# Patient Record
Sex: Female | Born: 1940 | Race: White | Hispanic: No | Marital: Married | Smoking: Never smoker
Health system: Southern US, Community
[De-identification: ages and names within clinical notes are randomized; demographics above are authoritative.]

## PROBLEM LIST (undated history)

## (undated) DIAGNOSIS — C801 Malignant (primary) neoplasm, unspecified: Secondary | ICD-10-CM

## (undated) DIAGNOSIS — I10 Essential (primary) hypertension: Secondary | ICD-10-CM

## (undated) DIAGNOSIS — E079 Disorder of thyroid, unspecified: Secondary | ICD-10-CM

## (undated) HISTORY — PX: TOTAL HIP ARTHROPLASTY: SHX124

## (undated) HISTORY — PX: ABDOMINAL HYSTERECTOMY: SHX81

---

## 1997-10-26 ENCOUNTER — Ambulatory Visit (HOSPITAL_COMMUNITY): Admission: RE | Admit: 1997-10-26 | Discharge: 1997-10-26 | Payer: Self-pay | Admitting: Internal Medicine

## 1999-01-24 ENCOUNTER — Ambulatory Visit (HOSPITAL_COMMUNITY): Admission: RE | Admit: 1999-01-24 | Discharge: 1999-01-24 | Payer: Self-pay | Admitting: Internal Medicine

## 1999-01-24 ENCOUNTER — Encounter: Payer: Self-pay | Admitting: Internal Medicine

## 2000-10-01 ENCOUNTER — Ambulatory Visit (HOSPITAL_COMMUNITY): Admission: RE | Admit: 2000-10-01 | Discharge: 2000-10-01 | Payer: Self-pay | Admitting: Internal Medicine

## 2004-06-06 ENCOUNTER — Ambulatory Visit (HOSPITAL_COMMUNITY): Admission: RE | Admit: 2004-06-06 | Discharge: 2004-06-06 | Payer: Self-pay | Admitting: Family Medicine

## 2005-08-07 ENCOUNTER — Ambulatory Visit (HOSPITAL_COMMUNITY): Admission: RE | Admit: 2005-08-07 | Discharge: 2005-08-07 | Payer: Self-pay | Admitting: Family Medicine

## 2006-09-10 ENCOUNTER — Encounter: Admission: RE | Admit: 2006-09-10 | Discharge: 2006-09-10 | Payer: Self-pay | Admitting: Family Medicine

## 2007-12-09 ENCOUNTER — Ambulatory Visit (HOSPITAL_COMMUNITY): Admission: RE | Admit: 2007-12-09 | Discharge: 2007-12-09 | Payer: Self-pay | Admitting: Internal Medicine

## 2009-02-05 ENCOUNTER — Ambulatory Visit (HOSPITAL_COMMUNITY): Admission: RE | Admit: 2009-02-05 | Discharge: 2009-02-05 | Payer: Self-pay | Admitting: Internal Medicine

## 2009-09-27 IMAGING — MG MM DIGITAL SCREENING BILAT
4 series · 4 of 4 positions shown · non-contrast
Comparison: none

DG SCREEN MAMMOGRAM BILATERAL
Bilateral CC and MLO view(s) were taken.

DIGITAL SCREENING MAMMOGRAM WITH CAD:
There are scattered fibroglandular densities.  No masses or malignant type calcifications are 
identified.  Compared with prior studies.

[R CC]
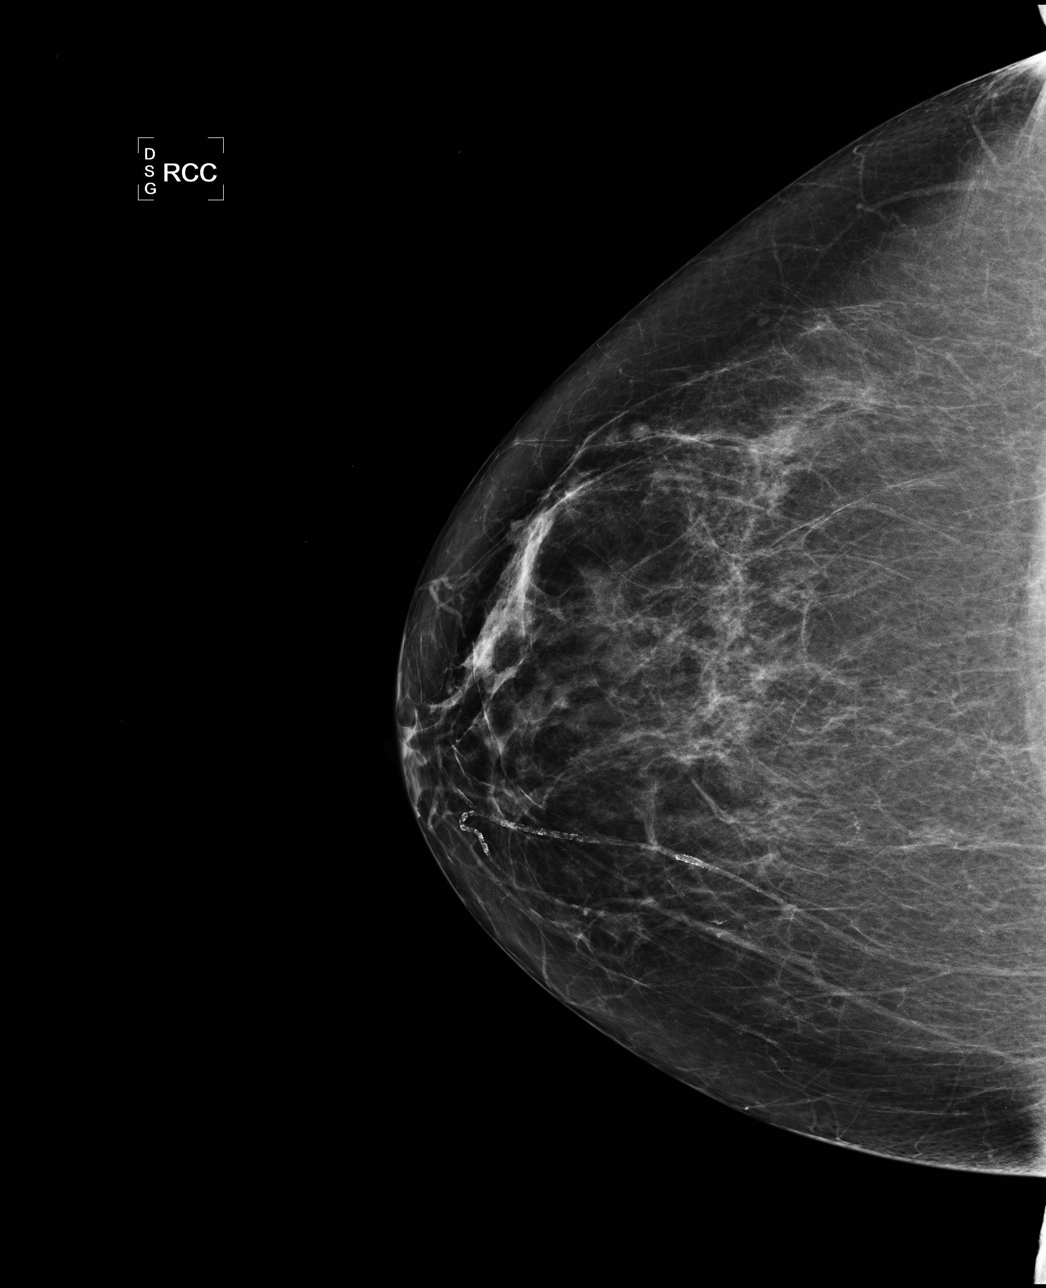

[R MLO]
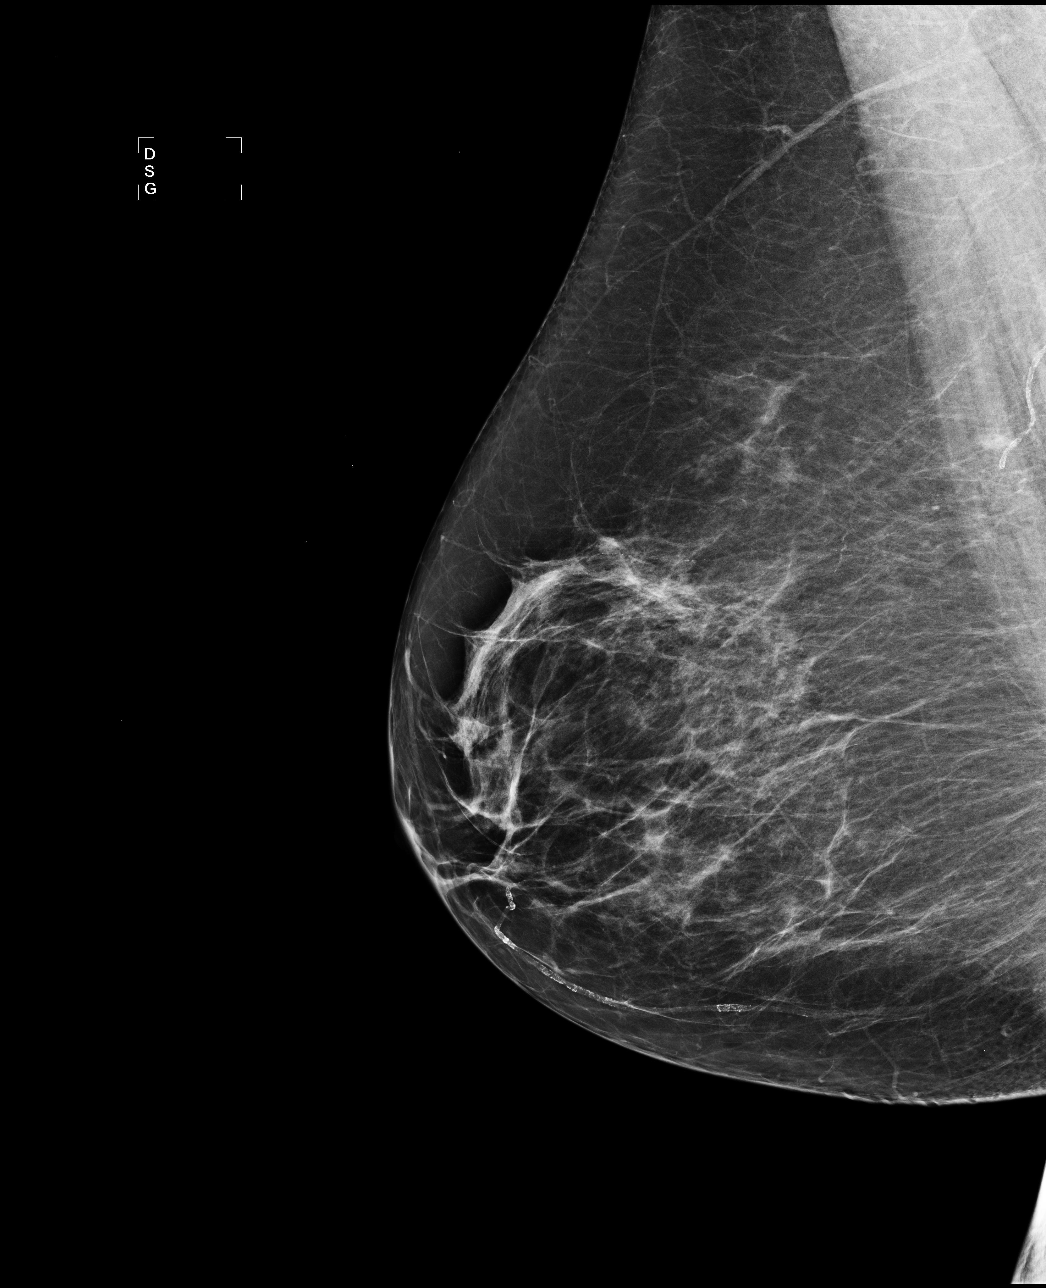

[L CC]
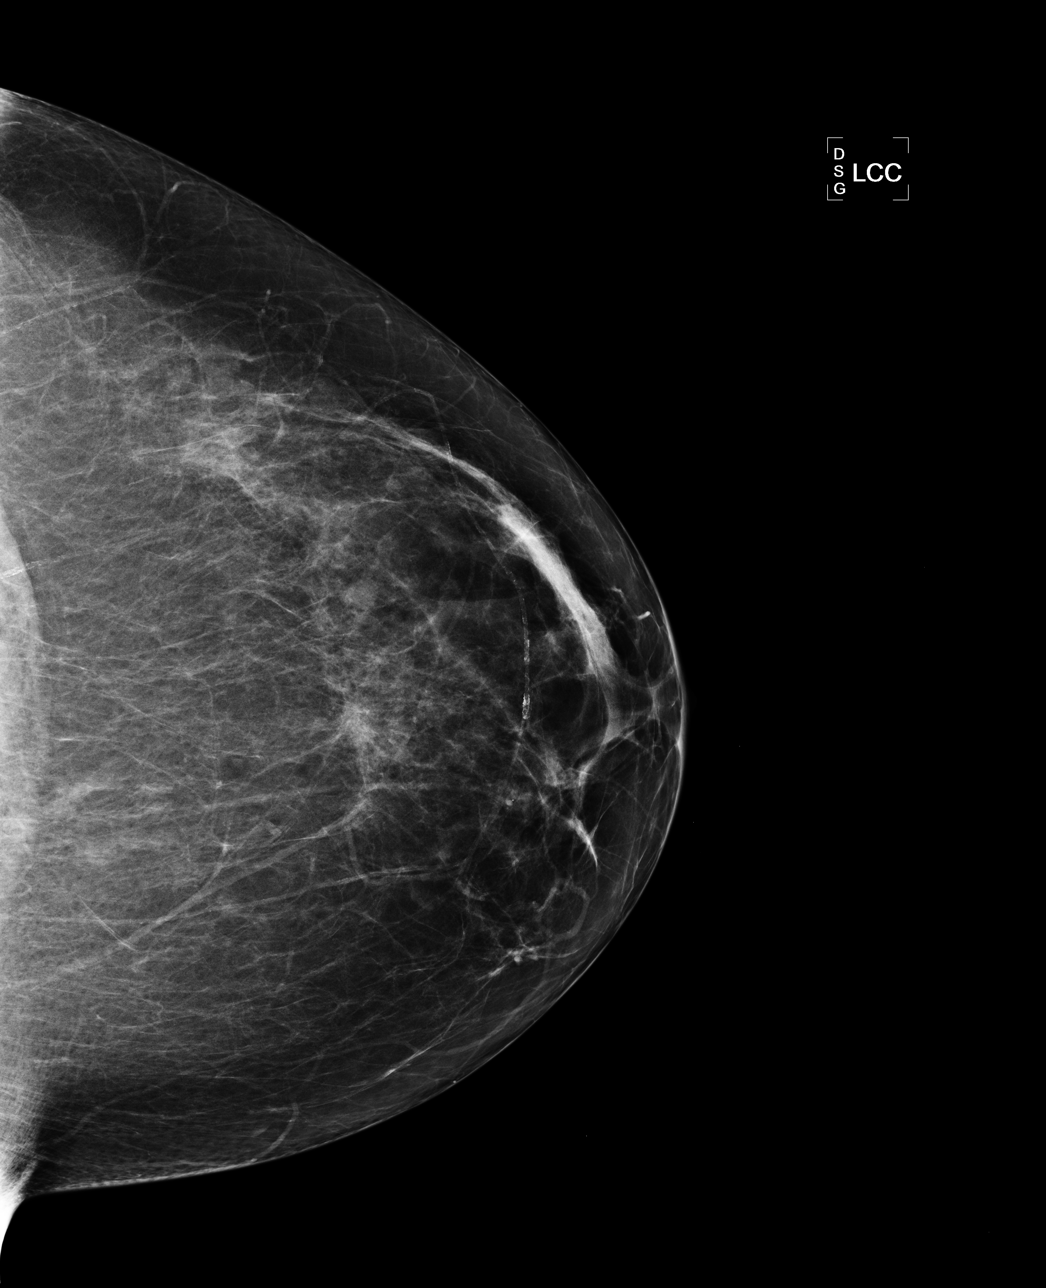

[L MLO]
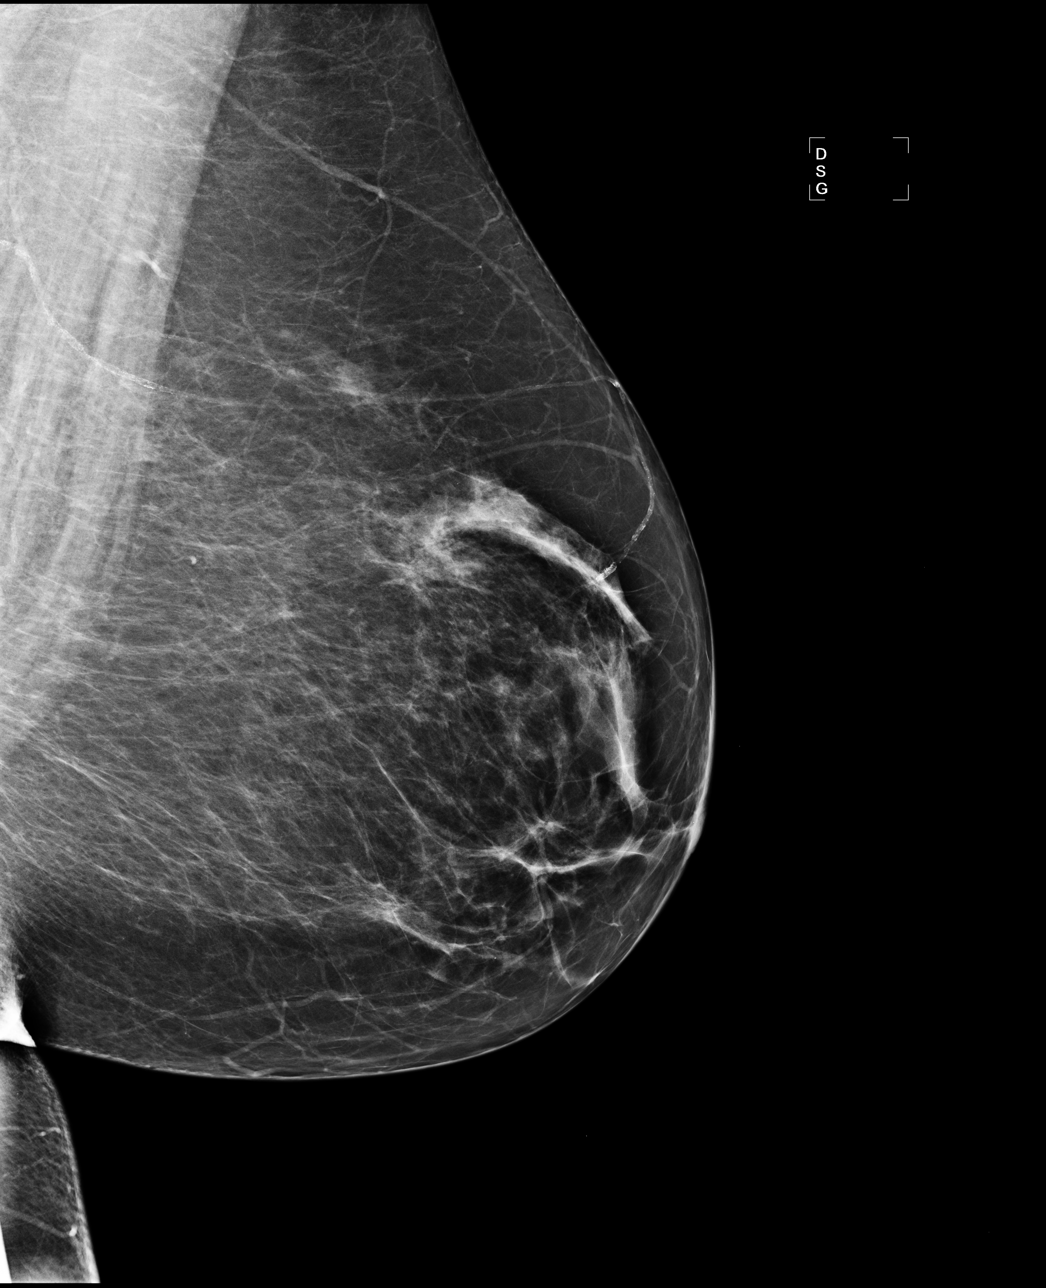

[4 of 4 positions shown; findings below may reference images not displayed]

IMPRESSION: No specific mammographic evidence of malignancy.  Next screening mammogram is recommended in one 
year.

ASSESSMENT: Negative - BI-RADS 1

Screening mammogram in 1 year.
ANALYZED BY COMPUTER AIDED DETECTION. , THIS PROCEDURE WAS A DIGITAL MAMMOGRAM.

## 2010-06-04 ENCOUNTER — Encounter: Payer: Self-pay | Admitting: Internal Medicine

## 2014-12-14 DIAGNOSIS — E039 Hypothyroidism, unspecified: Secondary | ICD-10-CM | POA: Insufficient documentation

## 2014-12-14 DIAGNOSIS — E876 Hypokalemia: Secondary | ICD-10-CM | POA: Insufficient documentation

## 2014-12-14 DIAGNOSIS — E785 Hyperlipidemia, unspecified: Secondary | ICD-10-CM | POA: Insufficient documentation

## 2014-12-14 DIAGNOSIS — I10 Essential (primary) hypertension: Secondary | ICD-10-CM | POA: Insufficient documentation

## 2015-06-26 DIAGNOSIS — I6782 Cerebral ischemia: Secondary | ICD-10-CM | POA: Insufficient documentation

## 2015-06-26 DIAGNOSIS — R1310 Dysphagia, unspecified: Secondary | ICD-10-CM | POA: Insufficient documentation

## 2015-09-07 DIAGNOSIS — M8580 Other specified disorders of bone density and structure, unspecified site: Secondary | ICD-10-CM | POA: Insufficient documentation

## 2015-11-03 DIAGNOSIS — H8112 Benign paroxysmal vertigo, left ear: Secondary | ICD-10-CM | POA: Insufficient documentation

## 2015-11-09 DIAGNOSIS — K219 Gastro-esophageal reflux disease without esophagitis: Secondary | ICD-10-CM | POA: Insufficient documentation

## 2016-02-08 DIAGNOSIS — F411 Generalized anxiety disorder: Secondary | ICD-10-CM | POA: Insufficient documentation

## 2016-02-21 DIAGNOSIS — M5030 Other cervical disc degeneration, unspecified cervical region: Secondary | ICD-10-CM | POA: Insufficient documentation

## 2016-09-12 DIAGNOSIS — R413 Other amnesia: Secondary | ICD-10-CM | POA: Insufficient documentation

## 2018-11-01 DIAGNOSIS — M5412 Radiculopathy, cervical region: Secondary | ICD-10-CM | POA: Insufficient documentation

## 2019-04-02 DIAGNOSIS — M810 Age-related osteoporosis without current pathological fracture: Secondary | ICD-10-CM | POA: Insufficient documentation

## 2019-06-05 DIAGNOSIS — F32A Depression, unspecified: Secondary | ICD-10-CM | POA: Insufficient documentation

## 2021-03-07 ENCOUNTER — Other Ambulatory Visit: Payer: Self-pay

## 2021-03-07 ENCOUNTER — Emergency Department (HOSPITAL_BASED_OUTPATIENT_CLINIC_OR_DEPARTMENT_OTHER)
Admission: EM | Admit: 2021-03-07 | Discharge: 2021-03-07 | Disposition: A | Discharge status: Home / Self Care | Payer: Medicare Other | Attending: Emergency Medicine | Admitting: Emergency Medicine

## 2021-03-07 ENCOUNTER — Encounter (HOSPITAL_BASED_OUTPATIENT_CLINIC_OR_DEPARTMENT_OTHER): Payer: Self-pay

## 2021-03-07 DIAGNOSIS — R443 Hallucinations, unspecified: Secondary | ICD-10-CM | POA: Diagnosis not present

## 2021-03-07 DIAGNOSIS — N390 Urinary tract infection, site not specified: Secondary | ICD-10-CM | POA: Insufficient documentation

## 2021-03-07 DIAGNOSIS — E876 Hypokalemia: Secondary | ICD-10-CM | POA: Diagnosis not present

## 2021-03-07 DIAGNOSIS — F02B4 Dementia in other diseases classified elsewhere, moderate, with anxiety: Secondary | ICD-10-CM | POA: Insufficient documentation

## 2021-03-07 DIAGNOSIS — Z79899 Other long term (current) drug therapy: Secondary | ICD-10-CM | POA: Diagnosis not present

## 2021-03-07 DIAGNOSIS — R799 Abnormal finding of blood chemistry, unspecified: Secondary | ICD-10-CM | POA: Diagnosis present

## 2021-03-07 DIAGNOSIS — I10 Essential (primary) hypertension: Secondary | ICD-10-CM | POA: Diagnosis not present

## 2021-03-07 DIAGNOSIS — Z859 Personal history of malignant neoplasm, unspecified: Secondary | ICD-10-CM | POA: Diagnosis not present

## 2021-03-07 DIAGNOSIS — Z7982 Long term (current) use of aspirin: Secondary | ICD-10-CM | POA: Insufficient documentation

## 2021-03-07 HISTORY — DX: Disorder of thyroid, unspecified: E07.9

## 2021-03-07 HISTORY — DX: Malignant (primary) neoplasm, unspecified: C80.1

## 2021-03-07 HISTORY — DX: Essential (primary) hypertension: I10

## 2021-03-07 LAB — BASIC METABOLIC PANEL
Anion gap: 9 (ref 5–15)
BUN: 22 mg/dL (ref 8–23)
CO2: 31 mmol/L (ref 22–32)
Calcium: 9.7 mg/dL (ref 8.9–10.3)
Chloride: 98 mmol/L (ref 98–111)
Creatinine, Ser: 0.82 mg/dL (ref 0.44–1.00)
GFR, Estimated: >60 mL/min (ref >60)
Glucose, Bld: 146 mg/dL — ABNORMAL HIGH (ref 70–99)
Potassium: 2.4 mmol/L — CL (ref 3.5–5.1)
Sodium: 138 mmol/L (ref 135–145)

## 2021-03-07 LAB — CBC WITH DIFFERENTIAL/PLATELET
Abs Immature Granulocytes: 0.02 10*3/uL (ref 0.00–0.07)
Basophils Absolute: 0.1 10*3/uL (ref 0.0–0.1)
Basophils Relative: 1 %
Eosinophils Absolute: 0.1 10*3/uL (ref 0.0–0.5)
Eosinophils Relative: 1 %
HCT: 37.5 % (ref 36.0–46.0)
Hemoglobin: 12.5 g/dL (ref 12.0–15.0)
Immature Granulocytes: 0 %
Lymphocytes Relative: 29 %
Lymphs Abs: 2.1 10*3/uL (ref 0.7–4.0)
MCH: 29.6 pg (ref 26.0–34.0)
MCHC: 33.3 g/dL (ref 30.0–36.0)
MCV: 88.9 fL (ref 80.0–100.0)
Monocytes Absolute: 0.8 10*3/uL (ref 0.1–1.0)
Monocytes Relative: 11 %
Neutro Abs: 4.2 10*3/uL (ref 1.7–7.7)
Neutrophils Relative %: 58 %
Platelets: 263 10*3/uL (ref 150–400)
RBC: 4.22 MIL/uL (ref 3.87–5.11)
RDW: 14.6 % (ref 11.5–15.5)
WBC: 7.2 10*3/uL (ref 4.0–10.5)
nRBC: 0 % (ref 0.0–0.2)

## 2021-03-07 LAB — POTASSIUM: Potassium: 3 mmol/L — ABNORMAL LOW (ref 3.5–5.1)

## 2021-03-07 MED ORDER — POTASSIUM CHLORIDE CRYS ER 20 MEQ PO TBCR
40.0000 meq | EXTENDED_RELEASE_TABLET | Freq: Once | ORAL | Status: AC
  Administered 2021-03-07: 40 meq via ORAL
  Filled 2021-03-07: qty 2

## 2021-03-07 MED ORDER — SODIUM CHLORIDE 0.9 % IV SOLN
1.0000 g | Freq: Once | INTRAVENOUS | Status: AC
  Administered 2021-03-07: 1 g via INTRAVENOUS
  Filled 2021-03-07: qty 10

## 2021-03-07 MED ORDER — POTASSIUM CHLORIDE 10 MEQ/100ML IV SOLN
10.0000 meq | INTRAVENOUS | Status: AC
Start: 2021-03-07 — End: 2021-03-07
  Administered 2021-03-07 (×2): 10 meq via INTRAVENOUS
  Filled 2021-03-07 (×2): qty 100

## 2021-03-07 MED ORDER — POTASSIUM CHLORIDE CRYS ER 20 MEQ PO TBCR
20.0000 meq | EXTENDED_RELEASE_TABLET | Freq: Once | ORAL | Status: AC
  Administered 2021-03-07: 20 meq via ORAL

## 2021-03-07 MED ORDER — CEPHALEXIN 500 MG PO CAPS
500.0000 mg | ORAL_CAPSULE | Freq: Four times a day (QID) | ORAL | 0 refills | Status: AC
Start: 2021-03-07 — End: ?

## 2021-03-07 NOTE — Discharge Instructions (Signed)
1.  You have been given a dose of Rocephin in the emergency department.  This is an antibiotic for your urinary tract infection.  You may start your Keflex as prescribed tomorrow evening for urinary tract infection. 2.  Your medication list indicates you have potassium 20 mEq to take once daily.  Take 20 mEq twice daily, 1 in the morning and 1 in the evening for the next 3 days, then resume your once daily dosing.  Try to schedule an appointment with your doctor for a recheck on your potassium within the next 2 to 3 days. 3.  Return to emergency part immediately if you are having any worsening confusion, weakness, fever new or unusual pain or other concerning symptoms

## 2021-03-07 NOTE — ED Provider Notes (Signed)
[facility] Provider Note   CSN: [id number] Arrival date & time: 03/07/21  1456     History Chief Complaint  Patient presents with   Abnormal Lab    [facility] is a 80 y.o. female.  HPI Patient sent to emergency department for low potassium level.  Patient reports she stopped taking her potassium about 2 weeks ago.  She states she did this because the pills are big and hard to take.  She reports she did not know what would result in having significant side effects or require her to come into the hospital.  Patient initially went to see her PCP because her daughters report she is having hallucinations.  This started about a week or 2 ago.  She is hallucinating people coming into her home.  Sometimes she might see someone standing on her deck smoking, a plumber or whole family coming into her home and sitting on the couch.  She does not seem to be having other associated symptoms.  Patient does have history of dementia and recently got tapered and then taken off of Exelon.  Patient had a fall couple weeks ago resulting in sacral fracture.  She has had increasing difficulty walking since that time.  She reports she is also having some burning pain in her feet.  She has had some injections through pain management but has not gotten much improvement.  Patient's daughters also report that a urinalysis was done today.  They suspected UTI might be contributing to the patient's symptoms.  They report that there was abnormal result in the "my chart".  However at this time the physician had not yet had time to review results and make recommendations.    Past Medical History:  Diagnosis Date   Cancer [facility])    Hypertension    Thyroid disease     There are no problems to display for this patient.      OB History   No obstetric history on file.     No family history on file.  Social History   Tobacco Use   Smoking status: Never   Smokeless tobacco:  Never  Vaping Use   Vaping Use: Never used  Substance Use Topics   Alcohol use: Never   Drug use: Never    Home Medications Prior to Admission medications   Medication Sig Start Date End Date Taking? Authorizing Provider  ALPRAZolam Duanne Moron) 0.25 MG tablet Take by mouth. 01/08/17  Yes [provider]  amLODipine (NORVASC) 5 MG tablet Take 1 tablet by mouth daily. 10/18/20  Yes [provider]  aspirin 81 MG EC tablet Take by mouth. 08/22/10  Yes [provider]  atorvastatin (LIPITOR) 10 MG tablet Take 1 tablet by mouth daily. 09/12/16  Yes [provider]  cephALEXin (KEFLEX) 500 MG capsule Take 1 capsule (500 mg total) by mouth 4 (four) times daily. 03/07/21  Yes Charlesetta Shanks, MD  dexlansoprazole (DEXILANT) 60 MG capsule Take by mouth. 07/19/15  Yes [provider]  escitalopram (LEXAPRO) 10 MG tablet Take 1 tablet by mouth daily. 09/06/17  Yes [provider]  esomeprazole (NEXIUM) 20 MG capsule Take by mouth. 01/10/21  Yes [provider]  fexofenadine (ALLEGRA) 180 MG tablet Take by mouth. 09/15/20  Yes [provider]  gabapentin (NEURONTIN) 100 MG capsule Take by mouth. 11/24/16  Yes [provider]  losartan (COZAAR) 25 MG tablet Take 1 tablet by mouth daily. 02/21/21  Yes [provider]  meloxicam (MOBIC) 15 MG tablet Take by mouth. 02/15/21 03/29/21 Yes [provider]  ofloxacin (OCUFLOX) 0.3 % ophthalmic solution 1 drop 4 times a day in designated eye, 3 days pre op and 3 days post op. 08/26/20 08/26/21 Yes [provider]  potassium chloride SA (KLOR-CON) 20 MEQ tablet Take 1 tablet by mouth daily. 01/08/17  Yes [provider]  triamterene-hydrochlorothiazide (DYAZIDE) 37.5-25 MG capsule Take 1 capsule by mouth every morning. 01/19/21  Yes [provider]  amLODipine (NORVASC) 10 MG tablet Take 10 mg by mouth daily. 09/15/20   [provider]  Cholecalciferol 25  MCG (1000 UT) capsule Take by mouth.    [provider]  DULoxetine (CYMBALTA) 60 MG capsule Take 60 mg by mouth daily. 12/21/20   [provider]  rivastigmine (EXELON) 4.5 MG capsule Take 4.5 mg by mouth 2 (two) times daily. 01/14/21   [provider]  triamterene-hydrochlorothiazide (DYAZIDE) 37.5-25 MG capsule Take 1 capsule by mouth every morning. 01/31/21   [provider]    Allergies    Patient has no known allergies.  Review of Systems   Review of Systems 10 systems reviewed and negative except as per HPI Physical Exam Updated Vital Signs BP (!) 146/74   Pulse 66   Temp 98.7 F (37.1 C) (Oral)   Resp 17   Ht 5\' 2"  (1.575 m)   Wt 54.9 kg   SpO2 92%   BMI 22.13 kg/m   Physical Exam Constitutional:      Comments: Alert nontoxic.  No respiratory distress.  Clear mental status  HENT:     Head: Normocephalic and atraumatic.     Mouth/Throat:     Pharynx: Oropharynx is clear.  Eyes:     Extraocular Movements: Extraocular movements intact.  Cardiovascular:     Rate and Rhythm: Normal rate and regular rhythm.  Pulmonary:     Effort: Pulmonary effort is normal.     Breath sounds: Normal breath sounds.  Abdominal:     General: There is no distension.     Palpations: Abdomen is soft.     Tenderness: There is no abdominal tenderness. There is no guarding.  Musculoskeletal:     Comments: Patient has significant low back curvature.  No peripheral edema.  Calf soft and nontender.  No signs of cellulitis or wounds to the feet  Skin:    General: Skin is warm and dry.  Neurological:     General: No focal deficit present.     Mental Status: She is oriented to person, place, and time.  Psychiatric:        Mood and Affect: Mood normal.    ED Results / Procedures / Treatments   Labs (all labs ordered are listed, but only abnormal results are displayed) Labs Reviewed  BASIC METABOLIC PANEL - Abnormal; Notable for the following components:       Result Value   Potassium 2.4 (*)    Glucose, Bld 146 (*)    All other components within normal limits  POTASSIUM - Abnormal; Notable for the following components:   Potassium 3.0 (*)    All other components within normal limits  URINE CULTURE  CBC WITH DIFFERENTIAL/PLATELET    EKG EKG Interpretation  Date/Time:  Monday March 07 2021 15:26:45 EDT Ventricular Rate:  80 PR Interval:  152 QRS Duration: 90 QT Interval:  370 QTC Calculation: 426 R Axis:   -57 Text Interpretation: Sinus rhythm with marked sinus arrhythmia Pulmonary disease pattern  Left anterior fascicular block Abnormal ECG agree, no old comparison Confirmed by Charlesetta Shanks [phone]) on 03/07/2021 9:33:02 PM  Radiology No results found.  Procedures Procedures   Medications Ordered in ED Medications  potassium chloride SA (KLOR-CON) CR tablet 20 mEq (has no administration in time range)  potassium chloride SA (KLOR-CON) CR tablet 40 mEq (40 mEq Oral Given 03/07/21 1715)  potassium chloride 10 mEq in 100 mL IVPB (0 mEq Intravenous Stopped 03/07/21 1922)  cefTRIAXone (ROCEPHIN) 1 g in sodium chloride 0.9 % 100 mL IVPB (0 g Intravenous Stopped 03/07/21 2109)    ED Course  I have reviewed the triage vital signs and the nursing notes.  Pertinent labs & imaging results that were available during my care of the patient were reviewed by me and considered in my medical decision making (see chart for details).    MDM Rules/Calculators/A&P                           Patient alert and nontoxic.  She is clinically well in appearance.  Patient was found to be significantly hypokalemic by diagnostic testing through PCP.  Patient has not overtly symptomatic.  She stopped taking potassium couple weeks ago, not realizing that it would make a significant difference in her medical care.  Patient also has had some hallucinations and suspected UTI.  Other possible causes for hallucinations are known history of dementia and recent  changes in dementia medications.  Review of labs done today are consistent with UTI.  Patient is nontoxic without any SIRS symptoms.  She is with family.  At this time will give first dose of Rocephin IV and start patient on Keflex.  Careful return precautions reviewed. Final Clinical Impression(s) / ED Diagnoses Final diagnoses:  Hypokalemia  Lower urinary tract infectious disease    Rx / DC Orders ED Discharge Orders          Ordered    cephALEXin (KEFLEX) 500 MG capsule  4 times daily        03/07/21 2137             Charlesetta Shanks, MD 03/07/21 2141

## 2021-03-07 NOTE — ED Triage Notes (Signed)
Pt was seen at PCP today for hallucinations, had bloodwork and urine checked. Potassium came back at 2.8. Hasnt been taking her potassium supplements. Denies symptoms.

## 2022-06-15 ENCOUNTER — Encounter (HOSPITAL_BASED_OUTPATIENT_CLINIC_OR_DEPARTMENT_OTHER): Payer: Self-pay

## 2022-06-15 ENCOUNTER — Emergency Department (HOSPITAL_BASED_OUTPATIENT_CLINIC_OR_DEPARTMENT_OTHER)
Admission: EM | Admit: 2022-06-15 | Discharge: 2022-06-15 | Disposition: A | Discharge status: Home / Self Care | Payer: Medicare Other | Attending: Emergency Medicine | Admitting: Emergency Medicine

## 2022-06-15 ENCOUNTER — Other Ambulatory Visit: Payer: Self-pay

## 2022-06-15 ENCOUNTER — Emergency Department (HOSPITAL_BASED_OUTPATIENT_CLINIC_OR_DEPARTMENT_OTHER): Payer: Medicare Other

## 2022-06-15 DIAGNOSIS — E039 Hypothyroidism, unspecified: Secondary | ICD-10-CM | POA: Diagnosis not present

## 2022-06-15 DIAGNOSIS — R440 Auditory hallucinations: Secondary | ICD-10-CM | POA: Diagnosis not present

## 2022-06-15 DIAGNOSIS — Z79899 Other long term (current) drug therapy: Secondary | ICD-10-CM | POA: Diagnosis not present

## 2022-06-15 DIAGNOSIS — I10 Essential (primary) hypertension: Secondary | ICD-10-CM | POA: Insufficient documentation

## 2022-06-15 DIAGNOSIS — H43813 Vitreous degeneration, bilateral: Secondary | ICD-10-CM | POA: Insufficient documentation

## 2022-06-15 DIAGNOSIS — G309 Alzheimer's disease, unspecified: Secondary | ICD-10-CM | POA: Insufficient documentation

## 2022-06-15 DIAGNOSIS — R443 Hallucinations, unspecified: Secondary | ICD-10-CM | POA: Diagnosis present

## 2022-06-15 DIAGNOSIS — R410 Disorientation, unspecified: Secondary | ICD-10-CM | POA: Insufficient documentation

## 2022-06-15 DIAGNOSIS — Z7982 Long term (current) use of aspirin: Secondary | ICD-10-CM | POA: Diagnosis not present

## 2022-06-15 LAB — URINALYSIS, MICROSCOPIC (REFLEX)

## 2022-06-15 LAB — COMPREHENSIVE METABOLIC PANEL
ALT: 11 U/L (ref 0–44)
AST: 17 U/L (ref 15–41)
Albumin: 4.2 g/dL (ref 3.5–5.0)
Alkaline Phosphatase: 78 U/L (ref 38–126)
Anion gap: 9 (ref 5–15)
BUN: 14 mg/dL (ref 8–23)
CO2: 34 mmol/L — ABNORMAL HIGH (ref 22–32)
Calcium: 9.8 mg/dL (ref 8.9–10.3)
Chloride: 98 mmol/L (ref 98–111)
Creatinine, Ser: 0.95 mg/dL (ref 0.44–1.00)
GFR, Estimated: 60 mL/min — ABNORMAL LOW (ref >60)
Glucose, Bld: 101 mg/dL — ABNORMAL HIGH (ref 70–99)
Potassium: 3 mmol/L — ABNORMAL LOW (ref 3.5–5.1)
Sodium: 141 mmol/L (ref 135–145)
Total Bilirubin: 0.7 mg/dL (ref 0.3–1.2)
Total Protein: 6.9 g/dL (ref 6.5–8.1)

## 2022-06-15 LAB — RAPID URINE DRUG SCREEN, HOSP PERFORMED
Amphetamines: NOT DETECTED
Barbiturates: NOT DETECTED
Benzodiazepines: NOT DETECTED
Cocaine: NOT DETECTED
Opiates: NOT DETECTED
Tetrahydrocannabinol: NOT DETECTED

## 2022-06-15 LAB — URINALYSIS, ROUTINE W REFLEX MICROSCOPIC
Bilirubin Urine: NEGATIVE
Glucose, UA: NEGATIVE mg/dL
Hgb urine dipstick: NEGATIVE
Ketones, ur: NEGATIVE mg/dL
Leukocytes,Ua: NEGATIVE
Nitrite: NEGATIVE
Protein, ur: 100 mg/dL — AB
Specific Gravity, Urine: 1.025 (ref 1.005–1.030)
pH: 6.5 (ref 5.0–8.0)

## 2022-06-15 LAB — CBC
HCT: 34.6 % — ABNORMAL LOW (ref 36.0–46.0)
Hemoglobin: 11.3 g/dL — ABNORMAL LOW (ref 12.0–15.0)
MCH: 26 pg (ref 26.0–34.0)
MCHC: 32.7 g/dL (ref 30.0–36.0)
MCV: 79.5 fL — ABNORMAL LOW (ref 80.0–100.0)
Platelets: 178 10*3/uL (ref 150–400)
RBC: 4.35 MIL/uL (ref 3.87–5.11)
RDW: 14.7 % (ref 11.5–15.5)
WBC: 6.9 10*3/uL (ref 4.0–10.5)
nRBC: 0 % (ref 0.0–0.2)

## 2022-06-15 LAB — ACETAMINOPHEN LEVEL: Acetaminophen (Tylenol), Serum: <10 ug/mL — ABNORMAL LOW (ref 10–30)

## 2022-06-15 LAB — ETHANOL: Alcohol, Ethyl (B): <10 mg/dL (ref <10)

## 2022-06-15 LAB — SALICYLATE LEVEL: Salicylate Lvl: <7 mg/dL — ABNORMAL LOW (ref 7.0–30.0)

## 2022-06-15 MED ORDER — POTASSIUM CHLORIDE CRYS ER 20 MEQ PO TBCR
40.0000 meq | EXTENDED_RELEASE_TABLET | Freq: Once | ORAL | Status: AC
  Administered 2022-06-15: 40 meq via ORAL
  Filled 2022-06-15: qty 2

## 2022-06-15 NOTE — ED Triage Notes (Signed)
Daughter states patient has had hallucinations since yesterday. Seeing people that aren't there. Daughter denies confusion, A&O x4 to orientation questions. Denies urinary symptoms.

## 2022-06-15 NOTE — Discharge Instructions (Signed)
Thank you for coming to [facility] Emergency Department. You were seen for confusion, auditory hallucinations. We did an exam, labs, and imaging, and these showed no acute findings. Please follow up with your primary care provider within 1 week for further workup and evaluation.  Do not hesitate to return to the ED or call 911 if you experience: -Worsening symptoms -Falls, trauma -Lethargy -Changes to vision -Lightheadedness, passing out -Fevers/chills -Anything else that concerns you

## 2022-06-15 NOTE — ED Provider Notes (Signed)
[facility] Provider Note   CSN: [id number] Arrival date & time: 06/15/22  [id number]     History  Chief Complaint  Patient presents with   Hallucinations    [facility] is a 82 y.o. female with Alzheimer's, HLD, HTN, hypothyroidism, GAD, GERD, osteoporosis, hypokalemia, depression, memory loss, BPPV, DDD, who presents with hallucinations.   Patient presents with daughter who provides history.  Patient's daughter states that she has noticed her mother be increasingly confused over the last week or so. She has noticed also that patient has had hallucinations since yesterday. Hearing people in her home that aren't there. Asked her daughter yesterday to come to her home to check to make sure there weren't people upstairs because she thought she heard them talking. Also discussed with her friend that she had a party the night before, but there was no party.  Patient states that she feels well at this time and has no complaints.  She has chronic lower back pain that is at its baseline.  She is currently A&Ox4, and she denies any recent fevers chills, chest pain, shortness of breath, abdominal pain, nausea or diarrhea constipation, lower extremity edema, falls or head trauma. She endorses recent confusion and states she thought she had a party but was surprised when there wasn't one. Denies any drug or alcohol use. Denies recent medication changes.  Daughter states this has happened before remotely and there was a medication cessation made that improved her symptoms. She wonders if there is a medication we could adjust today.   HPI     Home Medications Prior to Admission medications   Medication Sig Start Date End Date Taking? Authorizing Provider  ALPRAZolam Duanne Moron) 0.25 MG tablet Take by mouth. 01/08/17   [provider]  amLODipine (NORVASC) 10 MG tablet Take 10 mg by mouth daily. 09/15/20   [provider]  amLODipine  (NORVASC) 5 MG tablet Take 1 tablet by mouth daily. 10/18/20   [provider]  aspirin 81 MG EC tablet Take by mouth. 08/22/10   [provider]  atorvastatin (LIPITOR) 10 MG tablet Take 1 tablet by mouth daily. 09/12/16   [provider]  cephALEXin (KEFLEX) 500 MG capsule Take 1 capsule (500 mg total) by mouth 4 (four) times daily. 03/07/21   Charlesetta Shanks, MD  Cholecalciferol 25 MCG (1000 UT) capsule Take by mouth.    [provider]  dexlansoprazole (DEXILANT) 60 MG capsule Take by mouth. 07/19/15   [provider]  DULoxetine (CYMBALTA) 60 MG capsule Take 60 mg by mouth daily. 12/21/20   [provider]  escitalopram (LEXAPRO) 10 MG tablet Take 1 tablet by mouth daily. 09/06/17   [provider]  esomeprazole (NEXIUM) 20 MG capsule Take by mouth. 01/10/21   [provider]  fexofenadine (ALLEGRA) 180 MG tablet Take by mouth. 09/15/20   [provider]  gabapentin (NEURONTIN) 100 MG capsule Take by mouth. 11/24/16   [provider]  losartan (COZAAR) 25 MG tablet Take 1 tablet by mouth daily. 02/21/21   [provider]  potassium chloride SA (KLOR-CON) 20 MEQ tablet Take 1 tablet by mouth daily. 01/08/17   [provider]  rivastigmine (EXELON) 4.5 MG capsule Take 4.5 mg by mouth 2 (two) times daily. 01/14/21   [provider]  triamterene-hydrochlorothiazide (DYAZIDE) 37.5-25 MG capsule Take 1 capsule by mouth every morning. 01/19/21   [provider]  triamterene-hydrochlorothiazide (DYAZIDE) 37.5-25 MG capsule Take  1 capsule by mouth every morning. 01/31/21   [provider]      Allergies    Patient has no known allergies.    Review of Systems   Review of Systems Review of systems Negative for f/c, head trauma.  A 10 point review of systems was performed and is negative unless otherwise reported in HPI.  Physical Exam Updated Vital Signs BP (!) 164/68   Pulse 97    Temp 98.5 F (36.9 C)   Resp 18   Ht 5' 2"$  (1.575 m)   Wt 62.6 kg   SpO2 93%   BMI 25.24 kg/m  Physical Exam General: Normal appearing female, lying in bed.  HEENT: PERRLA, EOMI, no nystagmus, Sclera anicteric, MMM, trachea midline. Tongue protrudes midline. No tongue fasciculations or tremor.  Cardiology: RRR, no murmurs/rubs/gallops. BL radial and DP pulses equal bilaterally.  Resp: Normal respiratory rate and effort. CTAB, no wheezes, rhonchi, crackles.  Abd: Soft, non-tender, non-distended. No rebound tenderness or guarding.  GU: Deferred. MSK: No peripheral edema or signs of trauma. Extremities without deformity or TTP. No cyanosis or clubbing. Skin: warm, dry. No rashes or lesions. Back: No CVA tenderness Neuro: A&Ox4, but at times asks repeat questions, CNs II-XII grossly intact. MAEs. Sensation grossly intact.  Psych: Normal mood and affect.   ED Results / Procedures / Treatments   Labs (all labs ordered are listed, but only abnormal results are displayed) Labs Reviewed  COMPREHENSIVE METABOLIC PANEL - Abnormal; Notable for the following components:      Result Value   Potassium 3.0 (*)    CO2 34 (*)    Glucose, Bld 101 (*)    GFR, Estimated 60 (*)    All other components within normal limits  SALICYLATE LEVEL - Abnormal; Notable for the following components:   Salicylate Lvl Q000111Q (*)    All other components within normal limits  ACETAMINOPHEN LEVEL - Abnormal; Notable for the following components:   Acetaminophen (Tylenol), Serum <10 (*)    All other components within normal limits  CBC - Abnormal; Notable for the following components:   Hemoglobin 11.3 (*)    HCT 34.6 (*)    MCV 79.5 (*)    All other components within normal limits  URINALYSIS, ROUTINE W REFLEX MICROSCOPIC - Abnormal; Notable for the following components:   Protein, ur 100 (*)    All other components within normal limits  URINALYSIS, MICROSCOPIC (REFLEX) - Abnormal; Notable for the following  components:   Bacteria, UA RARE (*)    All other components within normal limits  ETHANOL  RAPID URINE DRUG SCREEN, HOSP PERFORMED    EKG None  Radiology CTH: IMPRESSION: 1. No acute intracranial abnormality. 2. Atrophy with chronic small vessel ischemic disease.    Procedures Procedures    Medications Ordered in ED Medications  potassium chloride SA (KLOR-CON M) CR tablet 40 mEq (40 mEq Oral Given 06/15/22 1244)    ED Course/ Medical Decision Making/ A&P                          Medical Decision Making Amount and/or Complexity of Data Reviewed Labs: ordered. Decision-making details documented in ED Course. Radiology: ordered. Decision-making details documented in ED Course.  Risk Prescription drug management.    This patient presents to the ED for concern of confusion/auditory hallucinations; this involves an extensive number of treatment options, and is a complaint that carries with it a high risk of complications  and morbidity.  I considered the following differential and admission for this acute, potentially life threatening condition.   MDM:    Ddx of acute altered mental status or encephalopathy considered but not limited to: -Intracranial abnormalities such as ICH, hydrocephalus - no report of head trauma and no signs of head trauma on exam but given h/o dementia and unclear history will obtain CTH -Low c/f infection such as UTI, PNA, or meningitis given no fever or symptoms, will obtain UA and basic labs to assess for electrolyte abnormalities or hyper/hypoglycemia -Toxic ingestion such as anticholinergic toxicity, illicit drug use, alcohol intake; patient denies any drug or alcohol use -No liver disease to suggest hepatic encephalopathy -Patient's daughter requests that I look at her medication list to make changes, as reportedly this has happened in the past that improved after stopping a medication, but daughter cannot remember what it was. I looked at  patient's medication list and also requested pharmacy's help, and we cannot see a medication that patient is currently taking that might have hallucinations as a side effect. Do not believe this would be an easy fix by stopping one of her meds. -Considered other causes of hallucinations such as wernicke's encephalopathy or alcoholic hallucinosis, but patient doesn't drink alcohol and has no other findings of either syndrome such as nystagmus, gait trouble, tremor, or vital sign abnormalities.  -Patient does have h/o alzheimer's dementia, consider worsening dementia    Clinical Course as of 07/01/22 1630  Thu Jun 15, 2022  1223 Potassium(!): 3.0 Will replete [HN]  1224 Acetaminophen (Tylenol), S(!): Q000111Q [HN]  [id number] Salicylate Lvl(!): Q000111Q [HN]  1224 Alcohol, Ethyl (B): <10 [HN]  1224 WBC: 6.9 No leukocytosis [HN]  1224 Hemoglobin(!): 11.3 Down from 12.5 one year ago, no significant bleeding reported [HN]  1224 CT Head Wo Contrast IMPRESSION: 1. No acute intracranial abnormality. 2. Atrophy with chronic small vessel ischemic disease.   [HN]  1230 Urinalysis, Routine w reflex microscopic -Urine, Clean Catch(!) Proteinuria without any e/o infection [HN]  1248 Rapid urine drug screen (hospital performed) Neg [HN]    Clinical Course User Index [HN] Audley Hose, MD    Labs: I Ordered, and personally interpreted labs.  The pertinent results include:  those listed above  Imaging Studies ordered: I ordered imaging studies including CTH I independently visualized and interpreted imaging. I agree with the radiologist interpretation  Additional history obtained from daughter at bedside, chart review.    Social Determinants of Health: Patient lives independently   Disposition:  Labs only significant for mild hypokalemia, repleted. No emergent findings on imaging or labs. Patient with no acute emergency causing hallucinations. She is A&Ox4, ambulatory, vitally stable. Stable for o/p  f/u.  D/w patient and her daughter about possibility of worsening dementia or possible need for MRI but do not believe it is emergent. Recommended f/u with PCP within 1 week. DC w/ discharge instructions/return precautions. All questions answered to patient's satisfaction.     Co morbidities that complicate the patient evaluation  Past Medical History:  Diagnosis Date   Cancer ([place])    Hypertension    Thyroid disease      Medicines Meds ordered this encounter  Medications   potassium chloride SA (KLOR-CON M) CR tablet 40 mEq    I have reviewed the patients home medicines and have made adjustments as needed  Problem List / ED Course: Problem List Items Addressed This Visit   None Visit Diagnoses     Confusion    -  Primary   Auditory hallucinations                       This note was created using dictation software, which may contain spelling or grammatical errors.    Audley Hose, MD 07/01/22 [phone]

## 2022-06-15 NOTE — ED Notes (Signed)
Discharge paperwork reviewed entirely with patient, including Rx's and follow up care. Pain was under control. Pt verbalized understanding as well as all parties involved. No questions or concerns voiced at the time of discharge. No acute distress noted.   Pt ambulated out to PVA without incident or assistance.

## 2022-06-15 NOTE — ED Notes (Signed)
Pt attempted to obtain urine specimen, unable to collect specimen at this time, cup provided

## 2023-03-02 ENCOUNTER — Emergency Department (HOSPITAL_COMMUNITY)
Admission: EM | Admit: 2023-03-02 | Discharge: 2023-03-02 | Disposition: A | Discharge status: Home / Self Care | Payer: Medicare Other

## 2023-03-02 ENCOUNTER — Other Ambulatory Visit: Payer: Self-pay

## 2023-03-02 ENCOUNTER — Encounter (HOSPITAL_COMMUNITY): Payer: Self-pay

## 2023-03-02 ENCOUNTER — Emergency Department (HOSPITAL_COMMUNITY): Payer: Medicare Other

## 2023-03-02 DIAGNOSIS — I10 Essential (primary) hypertension: Secondary | ICD-10-CM | POA: Insufficient documentation

## 2023-03-02 DIAGNOSIS — R296 Repeated falls: Secondary | ICD-10-CM | POA: Insufficient documentation

## 2023-03-02 DIAGNOSIS — Z859 Personal history of malignant neoplasm, unspecified: Secondary | ICD-10-CM | POA: Diagnosis not present

## 2023-03-02 DIAGNOSIS — Z7982 Long term (current) use of aspirin: Secondary | ICD-10-CM | POA: Insufficient documentation

## 2023-03-02 DIAGNOSIS — F0280 Dementia in other diseases classified elsewhere without behavioral disturbance: Secondary | ICD-10-CM | POA: Insufficient documentation

## 2023-03-02 DIAGNOSIS — R2689 Other abnormalities of gait and mobility: Secondary | ICD-10-CM

## 2023-03-02 DIAGNOSIS — R42 Dizziness and giddiness: Secondary | ICD-10-CM | POA: Diagnosis present

## 2023-03-02 DIAGNOSIS — G309 Alzheimer's disease, unspecified: Secondary | ICD-10-CM | POA: Diagnosis not present

## 2023-03-02 DIAGNOSIS — R200 Anesthesia of skin: Secondary | ICD-10-CM | POA: Diagnosis not present

## 2023-03-02 DIAGNOSIS — E039 Hypothyroidism, unspecified: Secondary | ICD-10-CM | POA: Diagnosis not present

## 2023-03-02 DIAGNOSIS — R262 Difficulty in walking, not elsewhere classified: Secondary | ICD-10-CM | POA: Insufficient documentation

## 2023-03-02 DIAGNOSIS — W0110XA Fall on same level from slipping, tripping and stumbling with subsequent striking against unspecified object, initial encounter: Secondary | ICD-10-CM | POA: Diagnosis not present

## 2023-03-02 DIAGNOSIS — R202 Paresthesia of skin: Secondary | ICD-10-CM | POA: Diagnosis not present

## 2023-03-02 DIAGNOSIS — Z79899 Other long term (current) drug therapy: Secondary | ICD-10-CM | POA: Insufficient documentation

## 2023-03-02 LAB — COMPREHENSIVE METABOLIC PANEL
ALT: 20 U/L (ref 0–44)
AST: 25 U/L (ref 15–41)
Albumin: 4.1 g/dL (ref 3.5–5.0)
Alkaline Phosphatase: 77 U/L (ref 38–126)
Anion gap: 8 (ref 5–15)
BUN: 25 mg/dL — ABNORMAL HIGH (ref 8–23)
CO2: 28 mmol/L (ref 22–32)
Calcium: 9.3 mg/dL (ref 8.9–10.3)
Chloride: 101 mmol/L (ref 98–111)
Creatinine, Ser: 1.32 mg/dL — ABNORMAL HIGH (ref 0.44–1.00)
GFR, Estimated: 40 mL/min — ABNORMAL LOW (ref >60)
Glucose, Bld: 105 mg/dL — ABNORMAL HIGH (ref 70–99)
Potassium: 4.2 mmol/L (ref 3.5–5.1)
Sodium: 137 mmol/L (ref 135–145)
Total Bilirubin: 0.4 mg/dL (ref 0.3–1.2)
Total Protein: 6.8 g/dL (ref 6.5–8.1)

## 2023-03-02 LAB — CBC WITH DIFFERENTIAL/PLATELET
Abs Immature Granulocytes: 0.02 10*3/uL (ref 0.00–0.07)
Basophils Absolute: 0.1 10*3/uL (ref 0.0–0.1)
Basophils Relative: 1 %
Eosinophils Absolute: 0.1 10*3/uL (ref 0.0–0.5)
Eosinophils Relative: 2 %
HCT: 36.4 % (ref 36.0–46.0)
Hemoglobin: 11.6 g/dL — ABNORMAL LOW (ref 12.0–15.0)
Immature Granulocytes: 0 %
Lymphocytes Relative: 17 %
Lymphs Abs: 1.2 10*3/uL (ref 0.7–4.0)
MCH: 28 pg (ref 26.0–34.0)
MCHC: 31.9 g/dL (ref 30.0–36.0)
MCV: 87.9 fL (ref 80.0–100.0)
Monocytes Absolute: 0.7 10*3/uL (ref 0.1–1.0)
Monocytes Relative: 10 %
Neutro Abs: 5.1 10*3/uL (ref 1.7–7.7)
Neutrophils Relative %: 70 %
Platelets: 156 10*3/uL (ref 150–400)
RBC: 4.14 MIL/uL (ref 3.87–5.11)
RDW: 13.7 % (ref 11.5–15.5)
WBC: 7.3 10*3/uL (ref 4.0–10.5)
nRBC: 0 % (ref 0.0–0.2)

## 2023-03-02 LAB — URINALYSIS, W/ REFLEX TO CULTURE (INFECTION SUSPECTED)
Bacteria, UA: NONE SEEN
Bilirubin Urine: NEGATIVE
Glucose, UA: NEGATIVE mg/dL
Hgb urine dipstick: NEGATIVE
Ketones, ur: NEGATIVE mg/dL
Leukocytes,Ua: NEGATIVE
Nitrite: NEGATIVE
Protein, ur: NEGATIVE mg/dL
Specific Gravity, Urine: 1.006 (ref 1.005–1.030)
pH: 6 (ref 5.0–8.0)

## 2023-03-02 LAB — VITAMIN B12: Vitamin B-12: 602 pg/mL (ref 180–914)

## 2023-03-02 LAB — CBG MONITORING, ED: Glucose-Capillary: 90 mg/dL (ref 70–99)

## 2023-03-02 LAB — TSH: TSH: 3.831 u[IU]/mL (ref 0.350–4.500)

## 2023-03-02 LAB — MAGNESIUM: Magnesium: 2.3 mg/dL (ref 1.7–2.4)

## 2023-03-02 MED ORDER — LACTATED RINGERS IV BOLUS
500.0000 mL | Freq: Once | INTRAVENOUS | Status: AC
  Administered 2023-03-02: 500 mL via INTRAVENOUS

## 2023-03-02 MED ORDER — LORAZEPAM 0.5 MG PO TABS
0.5000 mg | ORAL_TABLET | Freq: Once | ORAL | Status: DC
  Filled 2023-03-02: qty 1

## 2023-03-02 NOTE — ED Notes (Signed)
Per Dr. Rhae Hammock at bedside, hold ativan PO for administration at this time. Perform roadtest with patient with walker and assess pt ability to ambulate, physician to also discuss pt with hospitalist again for recommendations. Attempting to eliminate any additional sources for possible hospital acquired delirium at this time as pt is already restless, mildly confused at this time.

## 2023-03-02 NOTE — ED Provider Notes (Signed)
[place] EMERGENCY DEPARTMENT AT [facility] Provider Note   CSN: [id number] Arrival date & time: 03/02/23  4098     History  Chief Complaint  Patient presents with   Fall   Head Injury   Leg Pain    Sherry Calhoun is a 82 y.o. female.  HPI     82 year old female with a history of hypertension, hyperlipidemia, hypothyroidism, mild depression, macular degeneration, generalized anxiety disorder, Alzheimer's type dementia, tremor, spine pain now followed by pain clinic, vertigo for which she had benefited from vestibular rehab, imbalance, who presents with concern for fall, worsening numbness/pain of her bilateral feet, difficulty walking and feeling off balance.  Daughter reports for the last month she has been having numbness/pins and needles to the bilateral lower extremities. They discussed with her Neurologist at her regular appointment in October 1 for Alzheimers, per note they considered nerve conduction studies, per family were told to follow this with pain physicians.  They also ordered MRI CSpine for her being off balance, planning EMG.   Daughter reports last week she was walking and was having pain in both of her feet, right greater than left. She has had broken tailbone a year ago and still pain from that.  She has had ongoing and worsening of this pins/needles and foot pain/numbness over the last several weeks.    On Wednesday, she had a fall with a contusion noted on her right lower back/hip.  Last night she could not get out of her recliner and just slept there all night.  She called her daughter this morning to say she was unable to get out of her recliner due to right sided weakness, and her daughter was going to her house with plan to call EMS, when this may have decided to get up and bring her dish to the sink and subsequently fell backwards, hitting her head.  She denies being on anticoagulation, denies loss of consciousness.  She is worried about her  prior left hip replacement, but denies any specific pain over her left hip, denies right hip pain or pain with movement.  Reports that most of her pain is in both of her feet, right worse than left with the associated pins-and-needles and numbness.  She also reports she has low back/tailbone pain.    There is difficult for her t describe why she is having difficulty walking.  Denies it being related to hip pain.  There may be a component of it being difficult due to numbness and pain in her feet, but also describes being off balance, and having difficulty controlling her right leg.  They deny acute vision changes noting that she does have a history of macular degeneration, deny acute speech changes, other numbness or weakness other than what is described above, drooping of the face, double vision, fever, cough, shortness of breath, chest pain, abdominal pain, nausea, vomiting, diarrhea or urinary symptoms.  Past Medical History:  Diagnosis Date   Cancer (HCC)    Hypertension    Thyroid disease      Home Medications Prior to Admission medications   Medication Sig Start Date End Date Taking? Authorizing Provider  ALPRAZolam Prudy Feeler) 0.25 MG tablet Take by mouth. 01/08/17   [provider]  amLODipine (NORVASC) 10 MG tablet Take 10 mg by mouth daily. 09/15/20   [provider]  amLODipine (NORVASC) 5 MG tablet Take 1 tablet by mouth daily. 10/18/20   [provider]  aspirin 81 MG EC tablet  Take by mouth. 08/22/10   [provider]  atorvastatin (LIPITOR) 10 MG tablet Take 1 tablet by mouth daily. 09/12/16   [provider]  cephALEXin (KEFLEX) 500 MG capsule Take 1 capsule (500 mg total) by mouth 4 (four) times daily. 03/07/21   Arby Barrette, MD  Cholecalciferol 25 MCG (1000 UT) capsule Take by mouth.    [provider]  dexlansoprazole (DEXILANT) 60 MG capsule Take by mouth. 07/19/15   [provider]  DULoxetine (CYMBALTA) 60 MG capsule  Take 60 mg by mouth daily. 12/21/20   [provider]  escitalopram (LEXAPRO) 10 MG tablet Take 1 tablet by mouth daily. 09/06/17   [provider]  esomeprazole (NEXIUM) 20 MG capsule Take by mouth. 01/10/21   [provider]  fexofenadine (ALLEGRA) 180 MG tablet Take by mouth. 09/15/20   [provider]  gabapentin (NEURONTIN) 100 MG capsule Take by mouth. 11/24/16   [provider]  losartan (COZAAR) 25 MG tablet Take 1 tablet by mouth daily. 02/21/21   [provider]  potassium chloride SA (KLOR-CON) 20 MEQ tablet Take 1 tablet by mouth daily. 01/08/17   [provider]  rivastigmine (EXELON) 4.5 MG capsule Take 4.5 mg by mouth 2 (two) times daily. 01/14/21   [provider]  triamterene-hydrochlorothiazide (DYAZIDE) 37.5-25 MG capsule Take 1 capsule by mouth every morning. 01/19/21   [provider]  triamterene-hydrochlorothiazide (DYAZIDE) 37.5-25 MG capsule Take 1 capsule by mouth every morning. 01/31/21   [provider]      Allergies    Patient has no known allergies.    Review of Systems   Review of Systems  Physical Exam Updated Vital Signs BP (!) 143/83   Pulse 88   Temp 98.3 F (36.8 C) (Oral)   Resp 16   Ht 5\' 2"  (1.575 m)   Wt 61.2 kg   SpO2 99%   BMI 24.69 kg/m  Physical Exam Vitals and nursing note reviewed.  Constitutional:      General: She is not in acute distress.    Appearance: She is well-developed. She is not diaphoretic.  HENT:     Head: Normocephalic and atraumatic.  Eyes:     General: No visual field deficit.    Conjunctiva/sclera: Conjunctivae normal.  Cardiovascular:     Rate and Rhythm: Normal rate and regular rhythm.     Heart sounds: Normal heart sounds. No murmur heard.    No friction rub. No gallop.  Pulmonary:     Effort: Pulmonary effort is normal. No respiratory distress.     Breath sounds: Normal breath sounds. No wheezing or rales.  Abdominal:      General: There is no distension.     Palpations: Abdomen is soft.     Tenderness: There is no abdominal tenderness. There is no guarding.  Musculoskeletal:        General: No tenderness.     Cervical back: Normal range of motion.  Skin:    General: Skin is warm and dry.     Findings: No erythema or rash.  Neurological:     Mental Status: She is alert.     GCS: GCS eye subscore is 4. GCS verbal subscore is 5. GCS motor subscore is 6.     Cranial Nerves: No cranial nerve deficit, dysarthria or facial asymmetry.     Sensory: Sensory deficit (reports feels numbness both feet, right worse) present.     Motor: Weakness: questionable weakness to right side,  repeated several times. No drift..     Coordination: Coordination normal.     Gait: Gait abnormal (off balance standing, also feels right foot sort of stuck to floor. too off balance standing to ambulate at this time.).     ED Results / Procedures / Treatments   Labs (all labs ordered are listed, but only abnormal results are displayed) Labs Reviewed  CBC WITH DIFFERENTIAL/PLATELET - Abnormal; Notable for the following components:      Result Value   Hemoglobin 11.6 (*)    All other components within normal limits  COMPREHENSIVE METABOLIC PANEL - Abnormal; Notable for the following components:   Glucose, Bld 105 (*)    BUN 25 (*)    Creatinine, Ser 1.32 (*)    GFR, Estimated 40 (*)    All other components within normal limits  URINALYSIS, W/ REFLEX TO CULTURE (INFECTION SUSPECTED) - Abnormal; Notable for the following components:   Color, Urine STRAW (*)    All other components within normal limits  RPR  TSH  T4, FREE  VITAMIN B12  VITAMIN B1  MAGNESIUM  CBG MONITORING, ED    EKG None  Radiology DG Pelvis 1-2 Views  Result Date: 03/02/2023 CLINICAL DATA:  Dizziness.  Fall. EXAM: PELVIS - 1-2 VIEW COMPARISON:  None Available. FINDINGS: Examination is limited due to artifacts. No acute displaced fracture or dislocation.  No aggressive osseous lesion. Visualized sacral arcuate lines are unremarkable. Unremarkable symphysis pubis. There are mild degenerative changes of the right hip joint with mild joint space narrowing and osteophytosis of the superior acetabulum. Left hip arthroplasty noted. No periprosthetic fracture or lucency. The hardware is intact. There is near anatomic alignment. No radiopaque foreign bodies. IMPRESSION: 1. No acute displaced fracture seen within the limitations of this suboptimal exam. 2. Mild right hip degenerative joint disease. Electronically Signed   By: Jules Schick M.D.   On: 03/02/2023 13:27   DG Chest 2 View  Result Date: 03/02/2023 CLINICAL DATA:  dizziness, fall. EXAM: CHEST - 2 VIEW COMPARISON:  None Available. FINDINGS: Linear area of scarring/atelectasis noted overlying the left lower lung zone, laterally. Bilateral lung fields are otherwise clear. No consolidation or lung collapse. Bilateral costophrenic angles are clear. Normal cardio-mediastinal silhouette. No acute osseous abnormalities. The soft tissues are within normal limits. IMPRESSION: 1. No active cardiopulmonary disease. Electronically Signed   By: Jules Schick M.D.   On: 03/02/2023 13:25   CT Head Wo Contrast  Result Date: 03/02/2023 CLINICAL DATA:  Head trauma, minor (Age >= 65y); Neck trauma (Age >= 65y) EXAM: CT HEAD WITHOUT CONTRAST CT CERVICAL SPINE WITHOUT CONTRAST TECHNIQUE: Multidetector CT imaging of the head and cervical spine was performed following the standard protocol without intravenous contrast. Multiplanar CT image reconstructions of the cervical spine were also generated. RADIATION DOSE REDUCTION: This exam was performed according to the departmental dose-optimization program which includes automated exposure control, adjustment of the mA and/or kV according to patient size and/or use of iterative reconstruction technique. COMPARISON:  CT Head 06/15/22 FINDINGS: CT HEAD FINDINGS Brain: No hemorrhage. No  hydrocephalus. No extra-axial fluid collection. There is sequela of severe chronic microvascular ischemic change. No CT evidence of an acute cortical infarct. Cavum septum pellucidum et vergae. 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 Vascular: No hyperdense vessel or unexpected calcification. Skull: Normal. Negative for fracture or focal lesion. Sinuses/Orbits: No middle ear or mastoid effusion. Paranasal sinuses are clear. Bilateral lens  replacement. Orbits are otherwise unremarkable. Other: None. CT CERVICAL SPINE FINDINGS Alignment: Trace anterolisthesis of C2 on C3. Mild retrolisthesis of C4 on C5. Skull base and vertebrae: Age indeterminate inferior endplate compression deformity at C7. Soft tissues and spinal canal: No prevertebral fluid or swelling. No visible canal hematoma. Disc levels:  No evidence of high-grade spinal canal stenosis. Upper chest: Negative. Other: None IMPRESSION: 1. No acute intracranial abnormality. Sequela of severe chronic microvascular ischemic change. 2. Age indeterminate inferior endplate compression deformity at C7. Correlate with point tenderness. Electronically Signed   By: Lorenza Cambridge M.D.   On: 03/02/2023 12:16   CT Cervical Spine Wo Contrast  Result Date: 03/02/2023 CLINICAL DATA:  Head trauma, minor (Age >= 65y); Neck trauma (Age >= 65y) EXAM: CT HEAD WITHOUT CONTRAST CT CERVICAL SPINE WITHOUT CONTRAST TECHNIQUE: Multidetector CT imaging of the head and cervical spine was performed following the standard protocol without intravenous contrast. Multiplanar CT image reconstructions of the cervical spine were also generated. RADIATION DOSE REDUCTION: This exam was performed according to the departmental dose-optimization program which includes automated exposure control, adjustment of the mA and/or kV according to patient size and/or use of iterative reconstruction technique. COMPARISON:  CT Head 06/15/22 FINDINGS: CT  HEAD FINDINGS Brain: No hemorrhage. No hydrocephalus. No extra-axial fluid collection. There is sequela of severe chronic microvascular ischemic change. No CT evidence of an acute cortical infarct. Cavum septum pellucidum et vergae. 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 Vascular: No hyperdense vessel or unexpected calcification. Skull: Normal. Negative for fracture or focal lesion. Sinuses/Orbits: No middle ear or mastoid effusion. Paranasal sinuses are clear. Bilateral lens replacement. Orbits are otherwise unremarkable. Other: None. CT CERVICAL SPINE FINDINGS Alignment: Trace anterolisthesis of C2 on C3. Mild retrolisthesis of C4 on C5. Skull base and vertebrae: Age indeterminate inferior endplate compression deformity at C7. Soft tissues and spinal canal: No prevertebral fluid or swelling. No visible canal hematoma. Disc levels:  No evidence of high-grade spinal canal stenosis. Upper chest: Negative. Other: None IMPRESSION: 1. No acute intracranial abnormality. Sequela of severe chronic microvascular ischemic change. 2. Age indeterminate inferior endplate compression deformity at C7. Correlate with point tenderness. Electronically Signed   By: Lorenza Cambridge M.D.   On: 03/02/2023 12:16    Procedures Procedures    Medications Ordered in ED Medications  lactated ringers bolus 500 mL (500 mLs Intravenous New Bag/Given 03/02/23 1501)    ED Course/ Medical Decision Making/ A&P                                   82 year old female with a history of hypertension, hyperlipidemia, hypothyroidism, mild depression, macular degeneration, generalized anxiety disorder, Alzheimer's type dementia, tremor, spine pain now followed by pain clinic, vertigo for which she had benefited from vestibular rehab, imbalance, who presents with concern for fall, worsening numbness/pain of her bilateral feet, difficulty walking and feeling off balance.  There is a wide  differential for her difficulty walking.  She does not seem to have pain with range of motion of her hips, pain in her hips with weightbearing, or have tenderness to her foot or ankle and have low suspicion for acute traumatic etiology of her difficulty walking such as occult pelvic or hip fracture.  She is not having other systemic symptoms to suggest acute infection, hemorrhage, medication changes as etiology of her difficulty walking.  Labs are completed and personally evaluated interpreted by me show no clinically significant anemia, no leukocytosis.  She does have a mild AKI with a creatinine of 1.32 and mildly elevated BUN, suggesting some degree of dehydration.  Did order 500 cc of fluid in the setting of IV fluid shortage.  Urinalysis shows no evidence of infection.  CT head, cervical spine were completed and personally read by me and radiology and shows an age-indeterminate C7 fracture.  This was ordered given her history of head trauma, mild dementia.  She does not have tenderness over this area.  Discussed this with Dr. Danielle Dess neurosurgery who agrees with plan for MRI of the cervical and lumbar spine for further evaluation given findings on CT, and pelvic x-ray and her difficulty walking.  He recommends soft collar only if she is having pain.  We can reconsult with neurosurgery as needed pending MRI results.  Other possible etiologies of her difficulty walking include CVA with sensation of being off balance, other acute spinal pathology.   She is more hyperreflexive and have low suspicion for GBS.  Labs ordered for other possible etiologies of neuropathy.  MRI brain, cervical spine, thoracic spine, and lumbar spine ordered and pending.  Family at bedside.  They report she does live alone.        Final Clinical Impression(s) / ED Diagnoses Final diagnoses:  Difficulty walking  Multiple falls  Numbness and tingling of both feet  Imbalance  Dizziness    Rx / DC Orders ED Discharge  Orders     None         Alvira Monday, MD 03/02/23 1545

## 2023-03-02 NOTE — ED Notes (Signed)
Pt had successful road test with walker from ER room 8 to the bathroom and back to her room. Some mild hesitation observed with left lower extremity but otherwise gait steady and unremarkable. Dr. Rhae Hammock updated.

## 2023-03-02 NOTE — ED Provider Notes (Signed)
I was asked to follow-up on the patient's imaging studies.  The patient had a fall earlier today.  She has been having issues with some numbness and tingling in her feet over the past few weeks and has had a few falls at home.  The patient does have underlying dementia.  She was brought in today for further evaluation due to this.  Her workup thus far was unremarkable with exception of a compression deformity on CT of her cervical spine.  The patient denies any neck pain and is nontender over the area.  Physical Exam  BP (!) 144/66 (BP Location: Right Arm)   Pulse (!) 52   Temp 98.5 F (36.9 C) (Oral)   Resp 19   Ht 5\' 2"  (1.575 m)   Wt 61.2 kg   SpO2 94%   BMI 24.69 kg/m   Physical Exam Gen: NAD Neck: no C-spine tenderness Neuro: ambulatory with walker  Procedures  Procedures  ED Course / MDM    Medical Decision Making Amount and/or Complexity of Data Reviewed Labs: ordered. Radiology: ordered.  Risk Prescription drug management.   82 year old female with past medical history of dementia and chronic gait instability presenting to the emergency department today with a fall at home.  The patient is trauma workup here is reassuring.  MRIs were ordered for further evaluation for her symptoms.  While we were waiting for MRIs to be performed the patient started to have some confusion.  Ultimately, I did have a conversation with the patient and her daughter here as I am concerned that sedation for imaging studies could potentially make the confusion worse.  The patient's daughter actually asked if she could take the patient home if she was able to ambulate.  We did have the patient ambulate with a walker.  She is relatively steady here and the patient's daughter is okay taking her home.  She is ultimately discharged through shared decision making.  She is encouraged to follow-up with her neurologist.  I did place a transition of care consult to see about having some home physical therapy  set up for the patient.  The patient and her daughter are in agreement with this plan.  She is ultimately discharged through shared decision making after discussing risks and benefits of further hospitalization for the patient with dementia.       Durwin Glaze, MD 03/02/23 2218

## 2023-03-02 NOTE — Progress Notes (Signed)
[facility] day shift admitting team:  Chart reviewed and Case discussed with Dr. Dalene Seltzer.  We will wait for discussion with neurosurgery and/or neurology along with further imaging to develop a more specific treatment plan.    Sanda Klein, MD.

## 2023-03-02 NOTE — Discharge Instructions (Addendum)
Please call your neurologist next week to see if they can schedule the MRIs as an outpatient.  Return to the emergency department for worsening symptoms.

## 2023-03-02 NOTE — ED Triage Notes (Signed)
Pt BIBA from home. Pt fell back this AM, after moment of dizziness while doing dishes. Hit head on floor. No LOC, not on blood thinners. Pt also c/o pain in right leg that has been going on for several days and is concerned about their L hip replacement.   AOx4

## 2023-03-02 NOTE — Plan of Care (Signed)
[facility] Tonsillectomy/Adenoidectomy, Postoperative PEDS care plan entered in error.

## 2023-03-03 LAB — T4, FREE: Free T4: 0.87 ng/dL (ref 0.61–1.12)

## 2023-03-03 LAB — RPR: RPR Ser Ql: NONREACTIVE

## 2023-03-08 LAB — VITAMIN B1: Vitamin B1 (Thiamine): 121.6 nmol/L (ref 66.5–200.0)

## 2023-12-31 ENCOUNTER — Other Ambulatory Visit: Payer: Self-pay

## 2023-12-31 ENCOUNTER — Emergency Department (HOSPITAL_BASED_OUTPATIENT_CLINIC_OR_DEPARTMENT_OTHER)

## 2023-12-31 ENCOUNTER — Emergency Department (HOSPITAL_BASED_OUTPATIENT_CLINIC_OR_DEPARTMENT_OTHER): Admission: EM | Admit: 2023-12-31 | Discharge: 2023-12-31 | Disposition: A | Discharge status: Home / Self Care

## 2023-12-31 ENCOUNTER — Encounter (HOSPITAL_BASED_OUTPATIENT_CLINIC_OR_DEPARTMENT_OTHER): Payer: Self-pay

## 2023-12-31 DIAGNOSIS — Z7982 Long term (current) use of aspirin: Secondary | ICD-10-CM | POA: Diagnosis not present

## 2023-12-31 DIAGNOSIS — G309 Alzheimer's disease, unspecified: Secondary | ICD-10-CM | POA: Insufficient documentation

## 2023-12-31 DIAGNOSIS — E876 Hypokalemia: Secondary | ICD-10-CM | POA: Diagnosis not present

## 2023-12-31 DIAGNOSIS — R112 Nausea with vomiting, unspecified: Secondary | ICD-10-CM | POA: Diagnosis not present

## 2023-12-31 DIAGNOSIS — R410 Disorientation, unspecified: Secondary | ICD-10-CM | POA: Diagnosis present

## 2023-12-31 LAB — URINALYSIS, MICROSCOPIC (REFLEX)

## 2023-12-31 LAB — COMPREHENSIVE METABOLIC PANEL WITH GFR
ALT: 21 U/L (ref 0–44)
AST: 28 U/L (ref 15–41)
Albumin: 4.6 g/dL (ref 3.5–5.0)
Alkaline Phosphatase: 75 U/L (ref 38–126)
Anion gap: 14 (ref 5–15)
BUN: 17 mg/dL (ref 8–23)
CO2: 28 mmol/L (ref 22–32)
Calcium: 10.3 mg/dL (ref 8.9–10.3)
Chloride: 100 mmol/L (ref 98–111)
Creatinine, Ser: 0.81 mg/dL (ref 0.44–1.00)
GFR, Estimated: >60 mL/min (ref >60)
Glucose, Bld: 106 mg/dL — ABNORMAL HIGH (ref 70–99)
Potassium: 3.2 mmol/L — ABNORMAL LOW (ref 3.5–5.1)
Sodium: 141 mmol/L (ref 135–145)
Total Bilirubin: 0.7 mg/dL (ref 0.0–1.2)
Total Protein: 7.4 g/dL (ref 6.5–8.1)

## 2023-12-31 LAB — URINALYSIS, ROUTINE W REFLEX MICROSCOPIC
Bilirubin Urine: NEGATIVE
Glucose, UA: NEGATIVE mg/dL
Hgb urine dipstick: NEGATIVE
Ketones, ur: NEGATIVE mg/dL
Leukocytes,Ua: NEGATIVE
Nitrite: NEGATIVE
Protein, ur: 100 mg/dL — AB
Specific Gravity, Urine: 1.01 (ref 1.005–1.030)
pH: 7 (ref 5.0–8.0)

## 2023-12-31 LAB — CBC
HCT: 41.9 % (ref 36.0–46.0)
Hemoglobin: 14.1 g/dL (ref 12.0–15.0)
MCH: 29.6 pg (ref 26.0–34.0)
MCHC: 33.7 g/dL (ref 30.0–36.0)
MCV: 87.8 fL (ref 80.0–100.0)
Platelets: 180 K/uL (ref 150–400)
RBC: 4.77 MIL/uL (ref 3.87–5.11)
RDW: 12.9 % (ref 11.5–15.5)
WBC: 6.8 K/uL (ref 4.0–10.5)
nRBC: 0 % (ref 0.0–0.2)

## 2023-12-31 LAB — CBG MONITORING, ED: Glucose-Capillary: 105 mg/dL — ABNORMAL HIGH (ref 70–99)

## 2023-12-31 LAB — LIPASE, BLOOD: Lipase: 11 U/L (ref 11–51)

## 2023-12-31 MED ORDER — POTASSIUM CHLORIDE CRYS ER 20 MEQ PO TBCR
40.0000 meq | EXTENDED_RELEASE_TABLET | Freq: Once | ORAL | Status: AC
  Administered 2023-12-31: 40 meq via ORAL
  Filled 2023-12-31: qty 2

## 2023-12-31 MED ORDER — IOHEXOL 300 MG/ML  SOLN
100.0000 mL | Freq: Once | INTRAMUSCULAR | Status: AC | PRN
  Administered 2023-12-31: 100 mL via INTRAVENOUS

## 2023-12-31 NOTE — ED Provider Notes (Signed)
 [place] [facility] Provider Note   CSN: [id number] Arrival date & time: 12/31/23  9055     Patient presents with: Abdominal Pain   Sherry Calhoun is a 83 y.o. female with past medical history as needed for hyperlipidemia, degenerative disc disease, anxiety, Alzheimer's, osteoporosis, osteopenia who presents concern for new onset confusion since this weekend.  Per daughter stopped taking home meds yesterday because she thought they were poison.  Lives at home alone.  Has endorsed some hip/back pain which is not significantly abnormal from her baseline.  She endorses some intermittent nausea, vomiting, constipation.  She had a fall around a week ago with head injury, was assessed by EMS at the scene and ultimately at that time did not choose to go to the emergency department.    Abdominal Pain      Prior to Admission medications   Medication Sig Start Date End Date Taking? Authorizing Provider  ALPRAZolam (XANAX) 0.25 MG tablet Take by mouth. 01/08/17   [provider]  amLODipine (NORVASC) 10 MG tablet Take 10 mg by mouth daily. 09/15/20   [provider]  amLODipine (NORVASC) 5 MG tablet Take 1 tablet by mouth daily. 10/18/20   [provider]  aspirin 81 MG EC tablet Take by mouth. 08/22/10   [provider]  atorvastatin (LIPITOR) 10 MG tablet Take 1 tablet by mouth daily. 09/12/16   [provider]  cephALEXin  (KEFLEX ) 500 MG capsule Take 1 capsule (500 mg total) by mouth 4 (four) times daily. 03/07/21   Armenta Canning, MD  Cholecalciferol 25 MCG (1000 UT) capsule Take by mouth.    [provider]  dexlansoprazole (DEXILANT) 60 MG capsule Take by mouth. 07/19/15   [provider]  DULoxetine (CYMBALTA) 60 MG capsule Take 60 mg by mouth daily. 12/21/20   [provider]  escitalopram (LEXAPRO) 10 MG tablet Take 1 tablet by mouth daily. 09/06/17   [provider]   esomeprazole (NEXIUM) 20 MG capsule Take by mouth. 01/10/21   [provider]  fexofenadine (ALLEGRA) 180 MG tablet Take by mouth. 09/15/20   [provider]  gabapentin (NEURONTIN) 100 MG capsule Take by mouth. 11/24/16   [provider]  losartan (COZAAR) 25 MG tablet Take 1 tablet by mouth daily. 02/21/21   [provider]  potassium chloride  SA (KLOR-CON ) 20 MEQ tablet Take 1 tablet by mouth daily. 01/08/17   [provider]  rivastigmine (EXELON) 4.5 MG capsule Take 4.5 mg by mouth 2 (two) times daily. 01/14/21   [provider]  triamterene-hydrochlorothiazide (DYAZIDE) 37.5-25 MG capsule Take 1 capsule by mouth every morning. 01/19/21   [provider]  triamterene-hydrochlorothiazide (DYAZIDE) 37.5-25 MG capsule Take 1 capsule by mouth every morning. 01/31/21   [provider]    Allergies: Alendronate and Succinylcholine    Review of Systems  Gastrointestinal:  Positive for abdominal pain.  All other systems reviewed and are negative.   Updated Vital Signs BP (!) 160/62   Pulse (!) 55   Temp 98.4 F (36.9 C) (Oral)   Resp 18   Ht 5' 2 (1.575 m)   Wt 67.1 kg   SpO2 99%   BMI 27.07 kg/m   Physical Exam Vitals and nursing note reviewed.  Constitutional:      General: She is not in acute distress.    Appearance: Normal appearance.  HENT:     Head: Normocephalic and atraumatic.  Eyes:  General:        Right eye: No discharge.        Left eye: No discharge.  Cardiovascular:     Rate and Rhythm: Normal rate and regular rhythm.     Heart sounds: No murmur heard.    No friction rub. No gallop.  Pulmonary:     Effort: Pulmonary effort is normal.     Breath sounds: Normal breath sounds.  Abdominal:     General: Bowel sounds are normal.     Palpations: Abdomen is soft.     Comments: Mild tenderness to palpation in the lower abdomen, no rebound, rigidity, guarding.  Normal bowel sounds throughout.  No  abdominal distention.  Skin:    General: Skin is warm and dry.     Capillary Refill: Capillary refill takes less than 2 seconds.  Neurological:     Mental Status: She is alert.     Comments: Alert and oriented to self, mildly oriented to time, not oriented to place.  Answers to questions appear overall appropriate, not aphasic.  No slurred speech.  Cranial nerves II through XII grossly intact.  Intact finger-nose, intact heel-to-shin.  Romberg negative, gait normal. Moves all 4 limbs spontaneously, normal coordination.  No pronator drift.  Intact strength 5 out of 5 bilateral upper and lower extremities.    Psychiatric:        Mood and Affect: Mood normal.        Behavior: Behavior normal.     (all labs ordered are listed, but only abnormal results are displayed) Labs Reviewed  COMPREHENSIVE METABOLIC PANEL WITH GFR - Abnormal; Notable for the following components:      Result Value   Potassium 3.2 (*)    Glucose, Bld 106 (*)    All other components within normal limits  URINALYSIS, ROUTINE W REFLEX MICROSCOPIC - Abnormal; Notable for the following components:   Protein, ur 100 (*)    All other components within normal limits  URINALYSIS, MICROSCOPIC (REFLEX) - Abnormal; Notable for the following components:   Bacteria, UA RARE (*)    All other components within normal limits  CBG MONITORING, ED - Abnormal; Notable for the following components:   Glucose-Capillary 105 (*)    All other components within normal limits  CBC  LIPASE, BLOOD    EKG: EKG Interpretation Date/Time:  Monday December 31 2023 11:00:51 EDT Ventricular Rate:  61 PR Interval:  147 QRS Duration:  93 QT Interval:  462 QTC Calculation: 466 R Axis:   -48  Text Interpretation: Sinus rhythm Atrial premature complex Left anterior fascicular block Abnormal R-wave progression, late transition Left ventricular hypertrophy Confirmed by Sherry Barter [phone]) on 12/31/2023 11:02:36 AM  Radiology: CT ABDOMEN PELVIS W  CONTRAST Result Date: 12/31/2023 CLINICAL DATA:  Abdominal pain, acute, nonlocalized EXAM: CT ABDOMEN AND PELVIS WITH CONTRAST TECHNIQUE: Multidetector CT imaging of the abdomen and pelvis was performed using the standard protocol following bolus administration of intravenous contrast. RADIATION DOSE REDUCTION: This exam was performed according to the departmental dose-optimization program which includes automated exposure control, adjustment of the mA and/or kV according to patient size and/or use of iterative reconstruction technique. CONTRAST:  OMNIPAQUE  IOHEXOL  300 MG/ML  SOLN COMPARISON:  January 11, 2021 FINDINGS: Lower chest: No focal airspace consolidation or pleural effusion.Subsegmental atelectasis in the lung bases. Right coronary artery atherosclerosis. Hepatobiliary: The right hepatic dome is excluded from the field of view. Unchanged right hepatic lobe cyst.No radiopaque stones or wall thickening of the gallbladder.  No intrahepatic or extrahepatic biliary ductal dilation. The portal veins are patent. Pancreas: No mass or main ductal dilation. No peripancreatic inflammation or fluid collection. Spleen: Normal size. No mass. Adrenals/Urinary Tract: No adrenal masses. Multiple bilateral renal cysts. No nephrolithiasis or hydronephrosis. The urinary bladder is decompressed and partially obscured by metallic streak artifact from orthopedic hardware. No focal abnormality visualized. Stomach/Bowel: Moderate-sized hiatal hernia. The stomach is decompressed without focal abnormality. No small bowel wall thickening or inflammation. No small bowel obstruction.The appendix was not visualized. No right lower quadrant or pericecal inflammatory changes to suggest acute appendicitis. Scattered colonic diverticulosis. No changes of acute diverticulitis. Vascular/Lymphatic: No aortic aneurysm. Diffuse aortoiliac atherosclerosis. No intraabdominal or pelvic lymphadenopathy. Reproductive: Hysterectomy. No concerning  adnexal mass.No free pelvic fluid. Other: No pneumoperitoneum, ascites, or mesenteric inflammation. Musculoskeletal: No acute fracture or destructive lesion. Left hip arthroplasty is anatomically aligned without dislocation. Multilevel degenerative disc disease of the spine. Chronic compression fractures of T12 and L4. large Schmorl's node in the superior endplate of L2. IMPRESSION: 1. No acute intra-abdominal or pelvic abnormality. 2. Scattered colonic diverticulosis. No changes of acute diverticulitis. Moderate-sized hiatal hernia. Aortic Atherosclerosis (ICD10-I70.0). Electronically Signed   By: Rogelia Myers M.D.   On: 12/31/2023 13:15   CT Cervical Spine Wo Contrast Result Date: 12/31/2023 EXAM: CT HEAD AND CERVICAL SPINE 12/31/2023 12:36:21 PM TECHNIQUE: CT of the head and cervical spine was performed without the administration of intravenous contrast. Multiplanar reformatted images are provided for review. Automated exposure control, iterative reconstruction, and/or weight based adjustment of the mA/kV was utilized to reduce the radiation dose to as low as reasonably achievable. COMPARISON: CT head and cervical spine 03/02/2023. CLINICAL HISTORY: Head trauma, minor (Age >= 65y). Pt fell last Tuesday and hit head. Pt having new onset confusion since the weekend. Per daughter, pt stopped taking home meds yesterday because she thought they were poison. Pt lives at home alone. Pt started having hip/back pain on Saturday. Started having N/V. Reporting x1 episode vomiting x2 days ago. Pt reports constipation at baseline; took x1 dose milk of magnesia and now having diarrhea. Pt also having increased lethargy. FINDINGS: CT HEAD BRAIN AND VENTRICLES: No acute intracranial hemorrhage. No mass effect or midline shift. No abnormal extra-axial fluid collection. Gray-white differentiation is maintained. No hydrocephalus. Nonspecific hypoattenuation in the periventricular and subcortical white matter, most likely  representing chronic small vessel disease. Mild parenchymal volume loss. Remote lacunar infarct in the right thalamus. Additional small remote infarct in the right cerebellum. ORBITS: No acute abnormality. SINUSES AND MASTOIDS: Mild mucosal thickening in the ethmoid sinuses. SOFT TISSUES AND SKULL: No acute skull fracture. No acute soft tissue abnormality. Bilateral dense atherosclerosis at the skull base involving the carotid siphons and intracranial vertebral arteries. CT CERVICAL SPINE BONES AND ALIGNMENT: Cervical lordosis is maintained. Similar trace anterolisthesis of C7 on T1. No facet subluxation or dislocation. Similar compression deformity of C7 with anterior height loss and similar chronic deformity of the T2 superior endplate with mild height loss. Vertebral body heights are otherwise maintained. There is no evidence of acute impression fracture or displaced fracture in the cervical spine. No suspicious osseous lesion. DEGENERATIVE CHANGES: Disc osteophyte complexes at multiple levels. There is mild spinal canal stenosis at C4-5. No high-grade osseous spinal canal stenosis. Arthrosis and uncovertebral hypertrophy at multiple levels. There is significant foraminal narrowing at multiple levels. SOFT TISSUES: No prevertebral soft tissue swelling. IMPRESSION: 1. No acute intracranial abnormality. 2. No acute fracture or traumatic malalignment of the cervical spine.  Electronically signed by: Donnice Mania MD 12/31/2023 01:13 PM EDT RP Workstation: [id number]   CT Head Wo Contrast Result Date: 12/31/2023 EXAM: CT HEAD AND CERVICAL SPINE 12/31/2023 12:36:21 PM TECHNIQUE: CT of the head and cervical spine was performed without the administration of intravenous contrast. Multiplanar reformatted images are provided for review. Automated exposure control, iterative reconstruction, and/or weight based adjustment of the mA/kV was utilized to reduce the radiation dose to as low as reasonably achievable. COMPARISON:  CT head and cervical spine 03/02/2023. CLINICAL HISTORY: Head trauma, minor (Age >= 65y). Pt fell last Tuesday and hit head. Pt having new onset confusion since the weekend. Per daughter, pt stopped taking home meds yesterday because she thought they were poison. Pt lives at home alone. Pt started having hip/back pain on Saturday. Started having N/V. Reporting x1 episode vomiting x2 days ago. Pt reports constipation at baseline; took x1 dose milk of magnesia and now having diarrhea. Pt also having increased lethargy. FINDINGS: CT HEAD BRAIN AND VENTRICLES: No acute intracranial hemorrhage. No mass effect or midline shift. No abnormal extra-axial fluid collection. Gray-white differentiation is maintained. No hydrocephalus. Nonspecific hypoattenuation in the periventricular and subcortical white matter, most likely representing chronic small vessel disease. Mild parenchymal volume loss. Remote lacunar infarct in the right thalamus. Additional small remote infarct in the right cerebellum. ORBITS: No acute abnormality. SINUSES AND MASTOIDS: Mild mucosal thickening in the ethmoid sinuses. SOFT TISSUES AND SKULL: No acute skull fracture. No acute soft tissue abnormality. Bilateral dense atherosclerosis at the skull base involving the carotid siphons and intracranial vertebral arteries. CT CERVICAL SPINE BONES AND ALIGNMENT: Cervical lordosis is maintained. Similar trace anterolisthesis of C7 on T1. No facet subluxation or dislocation. Similar compression deformity of C7 with anterior height loss and similar chronic deformity of the T2 superior endplate with mild height loss. Vertebral body heights are otherwise maintained. There is no evidence of acute impression fracture or displaced fracture in the cervical spine. No suspicious osseous lesion. DEGENERATIVE CHANGES: Disc osteophyte complexes at multiple levels. There is mild spinal canal stenosis at C4-5. No high-grade osseous spinal canal stenosis. Arthrosis and  uncovertebral hypertrophy at multiple levels. There is significant foraminal narrowing at multiple levels. SOFT TISSUES: No prevertebral soft tissue swelling. IMPRESSION: 1. No acute intracranial abnormality. 2. No acute fracture or traumatic malalignment of the cervical spine. Electronically signed by: Donnice Mania MD 12/31/2023 01:13 PM EDT RP Workstation: [id number]   DG Chest Portable 1 View Result Date: 12/31/2023 CLINICAL DATA:  Chest pain. EXAM: PORTABLE CHEST 1 VIEW COMPARISON:  03/02/2023. FINDINGS: Trachea is midline. Heart size stable. Moderate to large hiatal hernia. Lungs are somewhat low in volume with linear scarring in the lingula and left lower lobe. No pleural fluid. IMPRESSION: 1. No acute findings. 2. Hiatal hernia. Electronically Signed   By: Newell Eke M.D.   On: 12/31/2023 11:02     Procedures   Medications Ordered in the ED  iohexol  (OMNIPAQUE ) 300 MG/ML solution 100 mL (100 mLs Intravenous Contrast Given 12/31/23 1221)  potassium chloride  SA (KLOR-CON  M) CR tablet 40 mEq (40 mEq Oral Given 12/31/23 1248)    Clinical Course as of 12/31/23 1341  Mon Dec 31, 2023  1031 Chronic back pain, moderate alzheimers, bppv. Fall last Tuesday. Lives alone -- fell flat back, hit head, back, did not lose consciousness. Needed assistance standing up. Some constipation but otherwise has been feeling alright -- worsening confusion from baseline starting Saturday. Episode of vomiting endorsed by patient (reflux of water).  [  CP]    Clinical Course User Index [CP] Sherry Sherlean DEL, PA-C                                 Medical Decision Making Amount and/or Complexity of Data Reviewed Labs: ordered. Radiology: ordered.  Risk Prescription drug management.    This patient is a 83 y.o. female  who presents to the ED for concern of ams, head injury, abdominal pain.   Differential diagnoses prior to evaluation: The emergent differential diagnosis includes, but is not limited  to,  CVA, seizure, hypotension, sepsis, hypoglycemia, hypoxic encephalopathy, metabolic encephalopathy, polypharmacy, substance abuse, developing dementia or alzheimers, meningitis, encephalitis, hypertensive emergency, other systemic infection, acute alcohol intoxication, acute alcohol or other drug withdrawal or psychiatric manifestation vs other, epidural hematoma, subdural hematoma, skull fracture, subarachnoid hemorrhage, unstable cervical spine fracture, concussion vs other MSK injury, The causes of generalized abdominal pain include but are not limited to AAA, mesenteric ischemia, appendicitis, diverticulitis, DKA, gastritis, gastroenteritis, AMI, nephrolithiasis, pancreatitis, peritonitis, adrenal insufficiency,lead poisoning, iron toxicity, intestinal ischemia, constipation, UTI,SBO/LBO, splenic rupture, biliary disease, IBD, IBS, PUD, or hepatitis . This is not an exhaustive differential.   Past Medical History / Co-morbidities / Social History:  hyperlipidemia, degenerative disc disease, anxiety, Alzheimer's, osteoporosis, osteopenia   Physical Exam: Physical exam performed. The pertinent findings include: Alert and oriented to self, mildly oriented to time, not oriented to place.  Answers to questions appear overall appropriate, not aphasic.  No slurred speech.  Cranial nerves II through XII grossly intact.  Intact finger-nose, intact heel-to-shin.  Romberg negative, gait normal. Moves all 4 limbs spontaneously, normal coordination.  No pronator drift.  Intact strength 5 out of 5 bilateral upper and lower extremities.  Mild tenderness to palpation in the lower abdomen, no rebound, rigidity, guarding.  Normal bowel sounds throughout.  No abdominal distention.   Lab Tests/Imaging studies: I personally interpreted labs/imaging and the pertinent results include: CBC unremarkable, UA unremarkable, normal lipase, normal CBG, her CMP is notable for mild hypokalemia potassium 3.2.  I am going to  read CT abdomen pelvis with contrast, CT C-spine, CT head without contrast, plain film chest x-ray which shows no evidence of acute intracranial abnormality, some diverticulosis without diverticulitis noted on CT abdomen.  No acute intrathoracic abnormality other than hiatal hernia.  I agree with the radiologist interpretation.  Cardiac monitoring: EKG obtained and interpreted by myself and attending physician which shows: Normal sinus rhythm, left ventricular hypertrophy   Medications: I ordered medication including potassium chloride  for mild hypokalemia.  I have reviewed the patients home medicines and have made adjustments as needed.   Disposition: After consideration of the diagnostic results and the patients response to treatment, I feel that I had a shared decision-making conversation with the patient, family about her altered mental status, delirium, I suspect that this is a slight worsening, variation of her known Alzheimer's disease, I do not see any evidence of acute infection or other abnormality to explain her symptoms today.  Given that she is overall at her mental baseline at time of reassessment felt that it was appropriate for her to be discharged at this time with close PCP follow-up, discussed extensive return precautions for worsening mental status or any new developing symptoms despite treatment.  Patient and family understand and agree to plan..   emergency department workup does not suggest an emergent condition requiring admission or immediate intervention beyond what has been performed  at this time. The plan is: as above. The patient is safe for discharge and has been instructed to return immediately for worsening symptoms, change in symptoms or any other concerns.   Final diagnoses:  Delirium  Hypokalemia    ED Discharge Orders     None          Sherry Sherlean VEAR DEVONNA 12/31/23 1341    Sherry Prentice SAUNDERS, MD 12/31/23 [phone]

## 2023-12-31 NOTE — Discharge Instructions (Addendum)
 Please follow-up closely with her primary care doctor, return if she has worsening delirium, altered mental status, confusion, new weakness, numbness, worsening abdominal pain, fever, chills.  Encouraged her to take her home medications as best as you are able. It was a pleasure taking care of you today.

## 2023-12-31 NOTE — ED Triage Notes (Signed)
 Pt fell last Tuesday and hit head. Pt having new onset confusion since the weekend. Per daughter, pt stopped taking home meds yesterday because she thought they were poison. Pt lives at home alone.   Pt started having hip/back pain on Saturday. Started having N/V. Reporting x1 episode vomiting x2 days ago. Pt reports constipation at baseline; took x1 dose milk of magnesia and now having diarrhea. Pt also having increased lethargy.
